# Patient Record
Sex: Male | Born: 1985 | Race: White | Hispanic: No | Marital: Single | Smoking: Current every day smoker
Health system: Southern US, Community
[De-identification: ages and names within clinical notes are randomized; demographics above are authoritative.]

## PROBLEM LIST (undated history)

## (undated) DIAGNOSIS — F909 Attention-deficit hyperactivity disorder, unspecified type: Secondary | ICD-10-CM

## (undated) DIAGNOSIS — F329 Major depressive disorder, single episode, unspecified: Secondary | ICD-10-CM

## (undated) DIAGNOSIS — F1020 Alcohol dependence, uncomplicated: Secondary | ICD-10-CM

## (undated) DIAGNOSIS — R569 Unspecified convulsions: Secondary | ICD-10-CM

## (undated) DIAGNOSIS — F419 Anxiety disorder, unspecified: Secondary | ICD-10-CM

## (undated) DIAGNOSIS — E7800 Pure hypercholesterolemia, unspecified: Secondary | ICD-10-CM

## (undated) DIAGNOSIS — F32A Depression, unspecified: Secondary | ICD-10-CM

---

## 2007-08-23 ENCOUNTER — Emergency Department: Payer: Self-pay | Admitting: Emergency Medicine

## 2007-08-24 ENCOUNTER — Emergency Department: Payer: Self-pay | Admitting: Emergency Medicine

## 2011-11-16 ENCOUNTER — Encounter (HOSPITAL_COMMUNITY): Payer: Self-pay | Admitting: *Deleted

## 2011-11-16 ENCOUNTER — Emergency Department (HOSPITAL_COMMUNITY)
Admission: EM | Admit: 2011-11-16 | Discharge: 2011-11-17 | Disposition: A | Discharge status: Other Acute Inpatient Hospital | Payer: Self-pay | Attending: Emergency Medicine | Admitting: Emergency Medicine

## 2011-11-16 DIAGNOSIS — IMO0002 Reserved for concepts with insufficient information to code with codable children: Secondary | ICD-10-CM

## 2011-11-16 DIAGNOSIS — F489 Nonpsychotic mental disorder, unspecified: Secondary | ICD-10-CM | POA: Insufficient documentation

## 2011-11-16 DIAGNOSIS — X789XXA Intentional self-harm by unspecified sharp object, initial encounter: Secondary | ICD-10-CM | POA: Insufficient documentation

## 2011-11-16 DIAGNOSIS — S1190XA Unspecified open wound of unspecified part of neck, initial encounter: Secondary | ICD-10-CM | POA: Insufficient documentation

## 2011-11-16 HISTORY — DX: Major depressive disorder, single episode, unspecified: F32.9

## 2011-11-16 HISTORY — DX: Attention-deficit hyperactivity disorder, unspecified type: F90.9

## 2011-11-16 HISTORY — DX: Anxiety disorder, unspecified: F41.9

## 2011-11-16 HISTORY — DX: Depression, unspecified: F32.A

## 2011-11-16 LAB — RAPID URINE DRUG SCREEN, HOSP PERFORMED
Amphetamines: NOT DETECTED
Opiates: NOT DETECTED

## 2011-11-16 LAB — CBC WITH DIFFERENTIAL/PLATELET
Basophils Absolute: 0 10*3/uL (ref 0.0–0.1)
Basophils Relative: 0 % (ref 0–1)
Eosinophils Relative: 0 % (ref 0–5)
Lymphocytes Relative: 16 % (ref 12–46)
MCHC: 35.7 g/dL (ref 30.0–36.0)
MCV: 90.8 fL (ref 78.0–100.0)
Neutro Abs: 7.7 10*3/uL (ref 1.7–7.7)
Platelets: 217 10*3/uL (ref 150–400)
RDW: 13 % (ref 11.5–15.5)
WBC: 10.5 10*3/uL (ref 4.0–10.5)

## 2011-11-16 LAB — BASIC METABOLIC PANEL
CO2: 22 mEq/L (ref 19–32)
Calcium: 9 mg/dL (ref 8.4–10.5)
GFR calc Af Amer: >90 mL/min (ref >90)
GFR calc non Af Amer: 84 mL/min — ABNORMAL LOW (ref >90)
Sodium: 142 mEq/L (ref 135–145)

## 2011-11-16 LAB — ETHANOL: Alcohol, Ethyl (B): 217 mg/dL — ABNORMAL HIGH (ref 0–11)

## 2011-11-16 MED ORDER — NICOTINE 14 MG/24HR TD PT24
MEDICATED_PATCH | TRANSDERMAL | Status: AC
  Administered 2011-11-16: 14 mg
  Filled 2011-11-16: qty 1

## 2011-11-16 MED ORDER — FLUOXETINE HCL 20 MG PO CAPS
ORAL_CAPSULE | ORAL | Status: AC
  Filled 2011-11-16: qty 2

## 2011-11-16 MED ORDER — FLUOXETINE HCL 20 MG PO CAPS
40.0000 mg | ORAL_CAPSULE | Freq: Every day | ORAL | Status: DC
  Administered 2011-11-17: 40 mg via ORAL
  Filled 2011-11-16 (×3): qty 2

## 2011-11-16 MED ORDER — LORAZEPAM 1 MG PO TABS
1.0000 mg | ORAL_TABLET | Freq: Three times a day (TID) | ORAL | Status: DC | PRN
  Administered 2011-11-16 – 2011-11-17 (×2): 1 mg via ORAL
  Filled 2011-11-16: qty 1

## 2011-11-16 MED ORDER — LORAZEPAM 1 MG PO TABS
ORAL_TABLET | ORAL | Status: AC
  Administered 2011-11-17: 1 mg via ORAL
  Filled 2011-11-16: qty 1
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
## 2011-11-16 NOTE — ED Provider Notes (Signed)
History     CSN: [id number]  Arrival date & time 11/16/11  1947   First MD Initiated Contact with Patient 11/16/11 2030      Chief Complaint  Patient presents with  . Medical Clearance    (Consider location/radiation/quality/duration/timing/severity/associated sxs/prior treatment) Patient is a 26 y.o. male presenting with altered mental status. The history is provided by the patient (pt states he is suicidal and cut his right side of his throat). No language interpreter was used.  Altered Mental Status This is a new problem. The current episode started 3 to 5 hours ago. The problem occurs rarely. The problem has not changed since onset.Pertinent negatives include no chest pain, no abdominal pain and no headaches. Nothing aggravates the symptoms. Nothing relieves the symptoms. He has tried nothing for the symptoms.    Past Medical History  Diagnosis Date  . Depression   . ADHD (attention deficit hyperactivity disorder)   . Anxiety     History reviewed. No pertinent past surgical history.  History reviewed. No pertinent family history.  History  Substance Use Topics  . Smoking status: Current Everyday Smoker  . Smokeless tobacco: Not on file  . Alcohol Use: Yes      Review of Systems  Constitutional: Negative for fatigue.  HENT: Negative for congestion, sinus pressure and ear discharge.   Eyes: Negative for discharge.  Respiratory: Negative for cough.   Cardiovascular: Negative for chest pain.  Gastrointestinal: Negative for abdominal pain and diarrhea.  Genitourinary: Negative for frequency and hematuria.  Musculoskeletal: Negative for back pain.  Skin: Negative for rash.  Neurological: Negative for seizures and headaches.  Hematological: Negative.   Psychiatric/Behavioral: Positive for suicidal ideas and altered mental status. Negative for hallucinations.    Allergies  Review of patient's allergies indicates no known allergies.  Home Medications   Current  Outpatient Rx  Name Route Sig Dispense Refill  . ASPIRIN-SALICYLAMIDE-CAFFEINE 325-95-16 MG PO TABS Oral Take 1 packet by mouth as needed.    Marland Kitchen FLUOXETINE HCL 40 MG PO CAPS Oral Take 40 mg by mouth every morning.      BP 148/63  Pulse 102  Temp 98.2 F (36.8 C) (Oral)  Resp 20  Ht 6' (1.829 m)  Wt 184 lb (83.462 kg)  BMI 24.95 kg/m2  SpO2 97%  Physical Exam  Constitutional: He is oriented to person, place, and time. He appears well-developed.  HENT:  Head: Normocephalic.       2 cm lac to right side of neck,  superficieal  Eyes: Conjunctivae and EOM are normal. No scleral icterus.  Neck: Neck supple. No thyromegaly present.  Cardiovascular: Normal rate and regular rhythm.  Exam reveals no gallop and no friction rub.   No murmur heard. Pulmonary/Chest: No stridor. He has no wheezes. He has no rales. He exhibits no tenderness.  Abdominal: He exhibits no distension. There is no tenderness. There is no rebound.  Musculoskeletal: Normal range of motion. He exhibits no edema.  Lymphadenopathy:    He has no cervical adenopathy.  Neurological: He is oriented to person, place, and time. Coordination normal.  Skin: No rash noted. No erythema.  Psychiatric:       suicidal    ED Course  LACERATION REPAIR Performed by: Debraann Livingstone L Authorized by: Bethann Berkshire L Comments: Right side of neck with 2 cm lac superficial.  Pt cleaned with betadine.  2 staples put in to close lac.  Pt tolerated procedure well   (including critical care time)  Labs Reviewed  CBC WITH DIFFERENTIAL - Abnormal; Notable for the following:    Monocytes Absolute 1.2 (*)     All other components within normal limits  BASIC METABOLIC PANEL - Abnormal; Notable for the following:    Glucose, Bld 103 (*)     GFR calc non Af Amer 84 (*)     All other components within normal limits  ETHANOL - Abnormal; Notable for the following:    Alcohol, Ethyl (B) 217 (*)     All other components within normal limits    URINE RAPID DRUG SCREEN (HOSP PERFORMED) - Abnormal; Notable for the following:    Tetrahydrocannabinol POSITIVE (*)     All other components within normal limits   No results found.   No diagnosis found.    MDM          Benny Lennert, MD 11/16/11 2151

## 2011-11-17 NOTE — Progress Notes (Signed)
Patient accepted by Verne Spurr, PA at [phone] on 11/17/11 pending 300 hall bed at [facility]. Rosey Bath, RN

## 2011-11-17 NOTE — ED Notes (Signed)
Patient's bag of belongings given to [facility] personnel.

## 2011-11-17 NOTE — ED Provider Notes (Signed)
Pt resting comfortably Has superficial lac to neck, no headache, visual changes or weakness reported Labs/vitals reviewed BP 141/89  Pulse 90  Temp 97.9 F (36.6 C) (Oral)  Resp 20  Ht 6' (1.829 m)  Wt 184 lb (83.462 kg)  BMI 24.95 kg/m2  SpO2 99%   Joya Gaskins, MD 11/17/11 [phone]
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
## 2011-11-17 NOTE — ED Notes (Signed)
[facility] on way to get pt. Report given to [employer] to [facility]

## 2012-03-17 DIAGNOSIS — R569 Unspecified convulsions: Secondary | ICD-10-CM

## 2012-03-17 HISTORY — DX: Unspecified convulsions: R56.9

## 2012-03-18 ENCOUNTER — Encounter (HOSPITAL_COMMUNITY): Payer: Self-pay | Admitting: Emergency Medicine

## 2012-03-18 ENCOUNTER — Inpatient Hospital Stay (HOSPITAL_COMMUNITY)
Admission: EM | Admit: 2012-03-18 | Discharge: 2012-03-19 | DRG: 918 | Discharge status: Against Medical Advice | Payer: Self-pay | Attending: Internal Medicine | Admitting: Internal Medicine

## 2012-03-18 ENCOUNTER — Emergency Department (HOSPITAL_COMMUNITY): Payer: Self-pay

## 2012-03-18 DIAGNOSIS — F32A Depression, unspecified: Secondary | ICD-10-CM

## 2012-03-18 DIAGNOSIS — F1110 Opioid abuse, uncomplicated: Secondary | ICD-10-CM | POA: Diagnosis present

## 2012-03-18 DIAGNOSIS — Z9119 Patient's noncompliance with other medical treatment and regimen: Secondary | ICD-10-CM

## 2012-03-18 DIAGNOSIS — R55 Syncope and collapse: Secondary | ICD-10-CM | POA: Diagnosis present

## 2012-03-18 DIAGNOSIS — Z818 Family history of other mental and behavioral disorders: Secondary | ICD-10-CM

## 2012-03-18 DIAGNOSIS — F909 Attention-deficit hyperactivity disorder, unspecified type: Secondary | ICD-10-CM

## 2012-03-18 DIAGNOSIS — IMO0002 Reserved for concepts with insufficient information to code with codable children: Secondary | ICD-10-CM | POA: Diagnosis present

## 2012-03-18 DIAGNOSIS — R413 Other amnesia: Secondary | ICD-10-CM | POA: Diagnosis present

## 2012-03-18 DIAGNOSIS — Z91199 Patient's noncompliance with other medical treatment and regimen due to unspecified reason: Secondary | ICD-10-CM

## 2012-03-18 DIAGNOSIS — T400X1A Poisoning by opium, accidental (unintentional), initial encounter: Principal | ICD-10-CM | POA: Diagnosis present

## 2012-03-18 DIAGNOSIS — R569 Unspecified convulsions: Secondary | ICD-10-CM

## 2012-03-18 DIAGNOSIS — Z7982 Long term (current) use of aspirin: Secondary | ICD-10-CM

## 2012-03-18 DIAGNOSIS — Y9289 Other specified places as the place of occurrence of the external cause: Secondary | ICD-10-CM

## 2012-03-18 DIAGNOSIS — F329 Major depressive disorder, single episode, unspecified: Secondary | ICD-10-CM | POA: Diagnosis present

## 2012-03-18 DIAGNOSIS — F411 Generalized anxiety disorder: Secondary | ICD-10-CM

## 2012-03-18 DIAGNOSIS — W19XXXA Unspecified fall, initial encounter: Secondary | ICD-10-CM | POA: Diagnosis present

## 2012-03-18 DIAGNOSIS — F1010 Alcohol abuse, uncomplicated: Secondary | ICD-10-CM

## 2012-03-18 DIAGNOSIS — E876 Hypokalemia: Secondary | ICD-10-CM

## 2012-03-18 DIAGNOSIS — F3289 Other specified depressive episodes: Secondary | ICD-10-CM | POA: Diagnosis present

## 2012-03-18 DIAGNOSIS — F1210 Cannabis abuse, uncomplicated: Secondary | ICD-10-CM | POA: Diagnosis present

## 2012-03-18 DIAGNOSIS — F17200 Nicotine dependence, unspecified, uncomplicated: Secondary | ICD-10-CM | POA: Diagnosis present

## 2012-03-18 DIAGNOSIS — F419 Anxiety disorder, unspecified: Secondary | ICD-10-CM

## 2012-03-18 LAB — CBC WITH DIFFERENTIAL/PLATELET
Eosinophils Absolute: 0 10*3/uL (ref 0.0–0.7)
Eosinophils Relative: 0 % (ref 0–5)
HCT: 45.6 % (ref 39.0–52.0)
Hemoglobin: 15.4 g/dL (ref 13.0–17.0)
Lymphocytes Relative: 17 % (ref 12–46)
Lymphs Abs: 3.5 10*3/uL (ref 0.7–4.0)
MCH: 33.2 pg (ref 26.0–34.0)
MCV: 98.3 fL (ref 78.0–100.0)
Monocytes Absolute: 1.9 10*3/uL — ABNORMAL HIGH (ref 0.1–1.0)
Monocytes Relative: 9 % (ref 3–12)
RBC: 4.64 MIL/uL (ref 4.22–5.81)
WBC: 21.4 10*3/uL — ABNORMAL HIGH (ref 4.0–10.5)

## 2012-03-18 LAB — BASIC METABOLIC PANEL
BUN: 8 mg/dL (ref 6–23)
CO2: 15 mEq/L — ABNORMAL LOW (ref 19–32)
Calcium: 9.2 mg/dL (ref 8.4–10.5)
GFR calc non Af Amer: 78 mL/min — ABNORMAL LOW (ref >90)
Glucose, Bld: 111 mg/dL — ABNORMAL HIGH (ref 70–99)

## 2012-03-18 LAB — RAPID URINE DRUG SCREEN, HOSP PERFORMED
Amphetamines: NOT DETECTED
Barbiturates: NOT DETECTED
Opiates: NOT DETECTED
Tetrahydrocannabinol: POSITIVE — AB

## 2012-03-18 MED ORDER — PHENYTOIN SODIUM 50 MG/ML IJ SOLN
INTRAMUSCULAR | Status: AC
  Filled 2012-03-18: qty 20

## 2012-03-18 MED ORDER — LORAZEPAM 2 MG/ML IJ SOLN: 1.0000 mg | Freq: Once | INTRAMUSCULAR | Status: DC

## 2012-03-18 MED ORDER — SODIUM CHLORIDE 0.9 % IV BOLUS (SEPSIS)
1000.0000 mL | Freq: Once | INTRAVENOUS | Status: AC
  Administered 2012-03-18: 1000 mL via INTRAVENOUS

## 2012-03-18 MED ORDER — POTASSIUM CHLORIDE 10 MEQ/100ML IV SOLN
10.0000 meq | INTRAVENOUS | Status: AC
  Administered 2012-03-18 – 2012-03-19 (×5): 10 meq via INTRAVENOUS
  Filled 2012-03-18: qty 400
  Filled 2012-03-18: qty 100

## 2012-03-18 MED ORDER — LORAZEPAM 2 MG/ML IJ SOLN
INTRAMUSCULAR | Status: AC
  Filled 2012-03-18: qty 1

## 2012-03-18 MED ORDER — LORAZEPAM 2 MG/ML IJ SOLN
2.0000 mg | Freq: Once | INTRAMUSCULAR | Status: AC
  Administered 2012-03-18: 2 mg via INTRAVENOUS

## 2012-03-18 MED ORDER — SODIUM CHLORIDE 0.9 % IV SOLN
1000.0000 mg | Freq: Once | INTRAVENOUS | Status: AC
  Administered 2012-03-18: 1000 mg via INTRAVENOUS
  Filled 2012-03-18: qty 20

## 2012-03-18 NOTE — ED Notes (Signed)
Patient states he took approximately 5 tramadol today and feels like that may have contributed to his seizure.

## 2012-03-18 NOTE — ED Notes (Signed)
Placed patient on monitor. Also placed ice pack to forehead.

## 2012-03-18 NOTE — ED Notes (Signed)
Patient denies back pain or neck pain. Patient complaining of nausea. Removed backboard per protocol and raised head of bed 30 degrees. Patient dry heaving at this time. No vomiting noted.

## 2012-03-18 NOTE — ED Notes (Signed)
Cleaned patients wounds and covered with gauze. No needs voiced by patient at this time.

## 2012-03-18 NOTE — ED Notes (Signed)
Was at bedside with patient with patient went into a seizure. Patient went stiff and shook all over with saliva forming at the mouth. Placed patient on his side. Seizure lasted approximately 30 seconds. MD summoned to bedside. Suctioned patient's mouth. Patient awake at this time but disoriented and states he does not remember anything.

## 2012-03-18 NOTE — ED Provider Notes (Signed)
History   This chart was scribed for Donnetta Hutching, MD by Sofie Rower, ED Scribe. The patient was seen in room APA08/[facility]08 and the patient's care was started at 9:02PM.     CSN: [id number]  Arrival date & time 03/18/12  2017   First MD Initiated Contact with Patient 03/18/12 2102      Chief Complaint  Patient presents with  . Seizures  . Head Injury    (Consider location/radiation/quality/duration/timing/severity/associated sxs/prior treatment) The history is provided by the patient and a parent. No language interpreter was used.    Joshua Dillon is a 26 y.o. male who presents to the Emergency Department complaining of sudden, progressively worsening, seizure, onset today (03/18/12). The pt reports he was en route to his AA meeting this afternoon, after drinking one 40 oz beer, and taking a tramadol. The pt cannot recall the details of the following events, but he suddenly woke up in the parking lot where his AA meeting was planned to take place, lying on his back. The pt informs he usually drinks 1-2 40 oz beers per day. The pt has a hx of depression, ADHD, and anxiety.  The pt is a current everyday smoker, in addition to drinking alcohol on a daily basis.   Past Medical History  Diagnosis Date  . Depression   . ADHD (attention deficit hyperactivity disorder)   . Anxiety     History reviewed. No pertinent past surgical history.  History reviewed. No pertinent family history.  History  Substance Use Topics  . Smoking status: Current Every Day Smoker  . Smokeless tobacco: Not on file  . Alcohol Use: Yes     Comment: daily      Review of Systems  Neurological: Positive for seizures.  All other systems reviewed and are negative.    Allergies  Review of patient's allergies indicates no known allergies.  Home Medications   Current Outpatient Rx  Name  Route  Sig  Dispense  Refill  . ASPIRIN-SALICYLAMIDE-CAFFEINE 325-95-16 MG PO TABS   Oral   Take 1 packet by mouth  as needed.         Marland Kitchen FLUOXETINE HCL 40 MG PO CAPS   Oral   Take 40 mg by mouth every morning.           BP 154/95  Pulse 120  Temp 97.7 F (36.5 C) (Oral)  Resp 16  Ht 6' (1.829 m)  Wt 200 lb (90.719 kg)  BMI 27.12 kg/m2  SpO2 99%  Physical Exam  Nursing note and vitals reviewed. Constitutional: He is oriented to person, place, and time. He appears well-developed and well-nourished.  HENT:  Head: Normocephalic and atraumatic.       Abrasions on forehead and left temporal area.   Eyes: Conjunctivae normal and EOM are normal. Pupils are equal, round, and reactive to light.  Neck: Normal range of motion. Neck supple.  Cardiovascular: Normal rate, regular rhythm and normal heart sounds.   Pulmonary/Chest: Effort normal and breath sounds normal.  Abdominal: Soft. Bowel sounds are normal.  Musculoskeletal: Normal range of motion.  Neurological: He is alert and oriented to person, place, and time.  Skin: Skin is warm and dry.  Psychiatric: He has a normal mood and affect.    ED Course  Procedures (including critical care time)  DIAGNOSTIC STUDIES: Oxygen Saturation is 99% on room air, normal by my interpretation.    COORDINATION OF CARE:  9:25 PM- Treatment plan concerning management of alcoholism, attendance at [facility]  meetings, CT head, CT cervical spine, laboratory analysis and drug screen discussed with patient. Pt agrees with treatment.  Ct Head Wo Contrast  03/18/2012  *RADIOLOGY REPORT*  Clinical Data:  Progressively worsening seizure; left frontal scalp hematoma.  Concern for cervical spine injury.  CT HEAD WITHOUT CONTRAST AND CT CERVICAL SPINE WITHOUT CONTRAST  Technique:  Multidetector CT imaging of the head and cervical spine was performed following the standard protocol without intravenous contrast.  Multiplanar CT image reconstructions of the cervical spine were also generated.  Comparison: None  CT HEAD  Findings: There is no evidence of acute infarction, mass  lesion, or intra- or extra-axial hemorrhage on CT.  The posterior fossa, including the cerebellum, brainstem and fourth ventricle, is within normal limits.  The third and lateral ventricles, and basal ganglia are unremarkable in appearance.  The cerebral hemispheres are symmetric in appearance, with normal gray- white differentiation.  No mass effect or midline shift is seen.  There is no evidence of fracture; visualized osseous structures are unremarkable in appearance.  The visualized portions of the orbits are within normal limits.  The paranasal sinuses and mastoid air cells are well-aerated.  Mild soft tissue swelling is noted overlying the left frontal calvarium, and surrounding the left orbit.  IMPRESSION:  1.  No evidence of traumatic intracranial injury or fracture. 2.  Mild soft tissue swelling overlying the left frontal calvarium, and surrounding the left orbit.  CT CERVICAL SPINE  Findings: There is no evidence of fracture or subluxation. Vertebral bodies demonstrate normal height and alignment.  There is minimal narrowing of the intervertebral disc space at C5-C6, with associated anterior and posterior disc osteophyte complexes. Prevertebral soft tissues are within normal limits.  The visualized neural foramina are grossly unremarkable.  The thyroid gland is unremarkable in appearance.  The visualized lung apices are clear.  No significant soft tissue abnormalities are seen.  IMPRESSION:  1.  No evidence of fracture or subluxation along the cervical spine.  2.  Minimal degenerative change at the mid cervical spine.   Original Report Authenticated By: Tonia Ghent, M.D.    Ct Cervical Spine Wo Contrast  03/18/2012  *RADIOLOGY REPORT*  Clinical Data:  Progressively worsening seizure; left frontal scalp hematoma.  Concern for cervical spine injury.  CT HEAD WITHOUT CONTRAST AND CT CERVICAL SPINE WITHOUT CONTRAST  Technique:  Multidetector CT imaging of the head and cervical spine was performed  following the standard protocol without intravenous contrast.  Multiplanar CT image reconstructions of the cervical spine were also generated.  Comparison: None  CT HEAD  Findings: There is no evidence of acute infarction, mass lesion, or intra- or extra-axial hemorrhage on CT.  The posterior fossa, including the cerebellum, brainstem and fourth ventricle, is within normal limits.  The third and lateral ventricles, and basal ganglia are unremarkable in appearance.  The cerebral hemispheres are symmetric in appearance, with normal gray- white differentiation.  No mass effect or midline shift is seen.  There is no evidence of fracture; visualized osseous structures are unremarkable in appearance.  The visualized portions of the orbits are within normal limits.  The paranasal sinuses and mastoid air cells are well-aerated.  Mild soft tissue swelling is noted overlying the left frontal calvarium, and surrounding the left orbit.  IMPRESSION:  1.  No evidence of traumatic intracranial injury or fracture. 2.  Mild soft tissue swelling overlying the left frontal calvarium, and surrounding the left orbit.  CT CERVICAL SPINE  Findings: There is no evidence of  fracture or subluxation. Vertebral bodies demonstrate normal height and alignment.  There is minimal narrowing of the intervertebral disc space at C5-C6, with associated anterior and posterior disc osteophyte complexes. Prevertebral soft tissues are within normal limits.  The visualized neural foramina are grossly unremarkable.  The thyroid gland is unremarkable in appearance.  The visualized lung apices are clear.  No significant soft tissue abnormalities are seen.  IMPRESSION:  1.  No evidence of fracture or subluxation along the cervical spine.  2.  Minimal degenerative change at the mid cervical spine.   Original Report Authenticated By: Tonia Ghent, M.D.     Results for orders placed during the hospital encounter of 03/18/12  CBC WITH DIFFERENTIAL       Component Value Range   WBC 21.4 (*) 4.0 - 10.5 K/uL   RBC 4.64  4.22 - 5.81 MIL/uL   Hemoglobin 15.4  13.0 - 17.0 g/dL   HCT 40.9  81.1 - 91.4 %   MCV 98.3  78.0 - 100.0 fL   MCH 33.2  26.0 - 34.0 pg   MCHC 33.8  30.0 - 36.0 g/dL   RDW 78.2  95.6 - 21.3 %   Platelets 283  150 - 400 K/uL   Neutrophils Relative 74  43 - 77 %   Neutro Abs 15.9 (*) 1.7 - 7.7 K/uL   Lymphocytes Relative 17  12 - 46 %   Lymphs Abs 3.5  0.7 - 4.0 K/uL   Monocytes Relative 9  3 - 12 %   Monocytes Absolute 1.9 (*) 0.1 - 1.0 K/uL   Eosinophils Relative 0  0 - 5 %   Eosinophils Absolute 0.0  0.0 - 0.7 K/uL   Basophils Relative 0  0 - 1 %   Basophils Absolute 0.0  0.0 - 0.1 K/uL  BASIC METABOLIC PANEL      Component Value Range   Sodium 144  135 - 145 mEq/L   Potassium 2.8 (*) 3.5 - 5.1 mEq/L   Chloride 101  96 - 112 mEq/L   CO2 15 (*) 19 - 32 mEq/L   Glucose, Bld 111 (*) 70 - 99 mg/dL   BUN 8  6 - 23 mg/dL   Creatinine, Ser 0.86  0.50 - 1.35 mg/dL   Calcium 9.2  8.4 - 57.8 mg/dL   GFR calc non Af Amer 78 (*) >90 mL/min   GFR calc Af Amer >90  >90 mL/min  ETHANOL      Component Value Range   Alcohol, Ethyl (B) <11  0 - 11 mg/dL    Labs Reviewed - No data to display No results found.   No diagnosis found.  CRITICAL CARE Performed by: Donnetta Hutching   Total critical care time: 30  Critical care time was exclusive of separately billable procedures and treating other patients.  Critical care was necessary to treat or prevent imminent or life-threatening deterioration.  Critical care was time spent personally by me on the following activities: development of treatment plan with patient and/or surrogate as well as nursing, discussions with consultants, evaluation of patient's response to treatment, examination of patient, obtaining history from patient or surrogate, ordering and performing treatments and interventions, ordering and review of laboratory studies, ordering and review of radiographic  studies, pulse oximetry and re-evaluation of patient's condition.  MDM  Suspect alcohol withdrawal seizure.  Patient had grand mal seizure in ED.  Rx IV Ativan, IV Dilantin.  Admit to general medicine      I personally performed  the services described in this documentation, which was scribed in my presence. The recorded information has been reviewed and is accurate.    Donnetta Hutching, MD 03/18/12 [phone]

## 2012-03-18 NOTE — ED Notes (Addendum)
Patient states he had a seizure and fell and hit the front of his head on asphalt. EMS states bystanders states the seizure lasted approximately 3 minutes. Patient has abrasions and hematoma on left forehead. Patient denies history of seizures. States he drinks daily and last drink was this morning. Patient is alert and oriented at triage. Patient has on c-collar and is on backboard upon arrival to ED.

## 2012-03-19 ENCOUNTER — Encounter (HOSPITAL_COMMUNITY): Payer: Self-pay | Admitting: Internal Medicine

## 2012-03-19 ENCOUNTER — Other Ambulatory Visit (HOSPITAL_COMMUNITY): Payer: Self-pay

## 2012-03-19 DIAGNOSIS — R569 Unspecified convulsions: Secondary | ICD-10-CM | POA: Diagnosis present

## 2012-03-19 DIAGNOSIS — F909 Attention-deficit hyperactivity disorder, unspecified type: Secondary | ICD-10-CM | POA: Diagnosis present

## 2012-03-19 DIAGNOSIS — F329 Major depressive disorder, single episode, unspecified: Secondary | ICD-10-CM

## 2012-03-19 DIAGNOSIS — F3289 Other specified depressive episodes: Secondary | ICD-10-CM

## 2012-03-19 DIAGNOSIS — F419 Anxiety disorder, unspecified: Secondary | ICD-10-CM | POA: Diagnosis present

## 2012-03-19 DIAGNOSIS — F1010 Alcohol abuse, uncomplicated: Secondary | ICD-10-CM | POA: Diagnosis present

## 2012-03-19 DIAGNOSIS — E876 Hypokalemia: Secondary | ICD-10-CM | POA: Diagnosis present

## 2012-03-19 LAB — CBC
Platelets: 226 10*3/uL (ref 150–400)
RBC: 4.09 MIL/uL — ABNORMAL LOW (ref 4.22–5.81)
RDW: 12.2 % (ref 11.5–15.5)
WBC: 11.9 10*3/uL — ABNORMAL HIGH (ref 4.0–10.5)

## 2012-03-19 LAB — HEPATIC FUNCTION PANEL
ALT: 24 U/L (ref 0–53)
AST: 28 U/L (ref 0–37)
Albumin: 4.3 g/dL (ref 3.5–5.2)
Alkaline Phosphatase: 89 U/L (ref 39–117)
Bilirubin, Direct: 0.1 mg/dL (ref 0.0–0.3)
Total Bilirubin: 0.3 mg/dL (ref 0.3–1.2)

## 2012-03-19 LAB — URINALYSIS, ROUTINE W REFLEX MICROSCOPIC
Glucose, UA: NEGATIVE mg/dL
Ketones, ur: NEGATIVE mg/dL
Leukocytes, UA: NEGATIVE
Nitrite: NEGATIVE
pH: 6.5 (ref 5.0–8.0)

## 2012-03-19 LAB — APTT: aPTT: 29 seconds (ref 24–37)

## 2012-03-19 LAB — BASIC METABOLIC PANEL
CO2: 24 mEq/L (ref 19–32)
Calcium: 8.4 mg/dL (ref 8.4–10.5)
Chloride: 105 mEq/L (ref 96–112)
Creatinine, Ser: 1.04 mg/dL (ref 0.50–1.35)
GFR calc Af Amer: >90 mL/min (ref >90)
Sodium: 140 mEq/L (ref 135–145)

## 2012-03-19 LAB — TSH: TSH: 2.339 u[IU]/mL (ref 0.350–4.500)

## 2012-03-19 MED ORDER — NICOTINE 21 MG/24HR TD PT24
21.0000 mg | MEDICATED_PATCH | Freq: Every day | TRANSDERMAL | Status: DC
  Administered 2012-03-19 (×2): 21 mg via TRANSDERMAL
  Filled 2012-03-19 (×2): qty 1

## 2012-03-19 MED ORDER — PHENYTOIN SODIUM EXTENDED 100 MG PO CAPS
300.0000 mg | ORAL_CAPSULE | Freq: Every day | ORAL | Status: DC
  Administered 2012-03-19: 300 mg via ORAL
  Filled 2012-03-19: qty 3

## 2012-03-19 MED ORDER — LORAZEPAM 1 MG PO TABS
1.0000 mg | ORAL_TABLET | Freq: Four times a day (QID) | ORAL | Status: DC | PRN
  Administered 2012-03-19: 1 mg via ORAL
  Filled 2012-03-19 (×2): qty 1

## 2012-03-19 MED ORDER — ADULT MULTIVITAMIN W/MINERALS CH
1.0000 | ORAL_TABLET | Freq: Every day | ORAL | Status: DC
  Administered 2012-03-19: 1 via ORAL
  Filled 2012-03-19: qty 1

## 2012-03-19 MED ORDER — THIAMINE HCL 100 MG/ML IJ SOLN
INTRAMUSCULAR | Status: AC
  Filled 2012-03-19: qty 2

## 2012-03-19 MED ORDER — ONDANSETRON HCL 4 MG/2ML IJ SOLN: 4.0000 mg | INTRAMUSCULAR | Status: DC | PRN

## 2012-03-19 MED ORDER — FLEET ENEMA 7-19 GM/118ML RE ENEM: 1.0000 | ENEMA | Freq: Once | RECTAL | Status: DC | PRN

## 2012-03-19 MED ORDER — SODIUM CHLORIDE 0.9 % IV SOLN
INTRAVENOUS | Status: DC
  Filled 2012-03-19 (×6): qty 1000

## 2012-03-19 MED ORDER — LORAZEPAM 2 MG/ML IJ SOLN
1.0000 mg | Freq: Four times a day (QID) | INTRAMUSCULAR | Status: DC | PRN
  Filled 2012-03-19: qty 1

## 2012-03-19 MED ORDER — FLUOXETINE HCL 20 MG PO CAPS
40.0000 mg | ORAL_CAPSULE | Freq: Every day | ORAL | Status: DC
  Administered 2012-03-19: 40 mg via ORAL
  Filled 2012-03-19: qty 2

## 2012-03-19 MED ORDER — M.V.I. ADULT IV INJ
INJECTION | INTRAVENOUS | Status: AC
  Filled 2012-03-19: qty 10

## 2012-03-19 MED ORDER — ACETAMINOPHEN 500 MG PO TABS
500.0000 mg | ORAL_TABLET | Freq: Four times a day (QID) | ORAL | Status: DC | PRN
  Administered 2012-03-19 (×2): 500 mg via ORAL
  Filled 2012-03-19 (×2): qty 1

## 2012-03-19 MED ORDER — THIAMINE HCL 100 MG/ML IJ SOLN: 100.0000 mg | Freq: Every day | INTRAMUSCULAR | Status: DC

## 2012-03-19 MED ORDER — BISACODYL 5 MG PO TBEC: 5.0000 mg | DELAYED_RELEASE_TABLET | Freq: Every day | ORAL | Status: DC | PRN

## 2012-03-19 MED ORDER — SODIUM CHLORIDE 0.9 % IJ SOLN: 3.0000 mL | Freq: Two times a day (BID) | INTRAMUSCULAR | Status: DC

## 2012-03-19 MED ORDER — LORAZEPAM 2 MG/ML IJ SOLN: 0.0000 mg | Freq: Two times a day (BID) | INTRAMUSCULAR | Status: DC

## 2012-03-19 MED ORDER — VITAMIN B-1 100 MG PO TABS
100.0000 mg | ORAL_TABLET | Freq: Every day | ORAL | Status: DC
  Administered 2012-03-19: 100 mg via ORAL
  Filled 2012-03-19: qty 1

## 2012-03-19 MED ORDER — POLYETHYLENE GLYCOL 3350 17 G PO PACK: 17.0000 g | PACK | Freq: Every day | ORAL | Status: DC | PRN

## 2012-03-19 MED ORDER — FOLIC ACID 5 MG/ML IJ SOLN
INTRAMUSCULAR | Status: AC
  Filled 2012-03-19: qty 0.2

## 2012-03-19 MED ORDER — LORAZEPAM 2 MG/ML IJ SOLN
0.0000 mg | Freq: Four times a day (QID) | INTRAMUSCULAR | Status: DC
  Administered 2012-03-19 (×2): 1 mg via INTRAVENOUS
  Filled 2012-03-19: qty 1

## 2012-03-19 MED ORDER — THIAMINE HCL 100 MG/ML IJ SOLN
Freq: Once | INTRAVENOUS | Status: AC
  Administered 2012-03-19: 02:00:00 via INTRAVENOUS
  Filled 2012-03-19: qty 1000

## 2012-03-19 MED ORDER — INFLUENZA VIRUS VACC SPLIT PF IM SUSP
0.5000 mL | Freq: Once | INTRAMUSCULAR | Status: AC
  Administered 2012-03-19: 0.5 mL via INTRAMUSCULAR
  Filled 2012-03-19: qty 0.5

## 2012-03-19 MED ORDER — FOLIC ACID 1 MG PO TABS
1.0000 mg | ORAL_TABLET | Freq: Every day | ORAL | Status: DC
  Administered 2012-03-19: 1 mg via ORAL
  Filled 2012-03-19: qty 1

## 2012-03-19 MED ORDER — POTASSIUM CHLORIDE IN NACL 20-0.9 MEQ/L-% IV SOLN
INTRAVENOUS | Status: DC
  Administered 2012-03-19: 09:00:00 via INTRAVENOUS

## 2012-03-19 MED ORDER — POTASSIUM CHLORIDE CRYS ER 20 MEQ PO TBCR
40.0000 meq | EXTENDED_RELEASE_TABLET | Freq: Once | ORAL | Status: AC
  Administered 2012-03-19: 40 meq via ORAL
  Filled 2012-03-19: qty 2

## 2012-03-19 MED ORDER — PNEUMOCOCCAL VAC POLYVALENT 25 MCG/0.5ML IJ INJ
0.5000 mL | INJECTION | Freq: Once | INTRAMUSCULAR | Status: AC
  Administered 2012-03-19: 0.5 mL via INTRAMUSCULAR
  Filled 2012-03-19: qty 0.5

## 2012-03-19 NOTE — H&P (Signed)
[facility] History and Physical  Joshua Dillon  RUE:[id number]  DOB: 03-16-1986   DOA: 03/18/2012   PCP:   No primary provider on file.   Chief Complaint:  Seizure  HPI: Joshua Dillon is an 26 y.o. male.   Young Caucasian gentleman with history of alcohol abuse, and a family history of a addictions, has been attending Alcoholics Anonymous for the past 6 weeks, but continues to drink intermittently.. his last drink was yesterday morning says he drank 40 ounces of beer.  He should was in fact brought in because he had a grand mal seizure suffered injuries to his head and arms, and in the emergency room had a further witnessed grand mal seizure. He was given intravenous Ativan and loaded with Dilantin, but is now again become alert and agitated and the hospitalist service is been called to assist with management.  He denies any previous history of seizures, although the records indicate that his father says he used to take Depakote. He does have a significant psychiatric history including anxiety and depression, and ADHD, but is apparently not seeing a psychiatrist. He says that he is very interested in getting alcohol detox and is very serious about quitting, but in conversing with him he seems somewhat confused and his mind wanders constantly.  Admits to occasional marijuana use last use a few days ago; denies cocaine use.  Gives no history of fever or headache prior to the trauma associated with the seizures.   Rewiew of Systems:   All systems negative except as marked bold or noted in the HPI;  Constitutional: Negative for malaise, fever and chills. ;  Eyes: Negative for eye pain, redness and discharge. ;  ENMT: Negative for ear pain, hoarseness, nasal congestion, sinus pressure and sore throat. ;  Cardiovascular: Negative for chest pain, palpitations, diaphoresis, dyspnea and peripheral edema. ;  Respiratory: Negative for cough, hemoptysis, wheezing and stridor. ;   Gastrointestinal: Negative for nausea, vomiting, diarrhea, constipation, abdominal pain, melena, blood in stool, hematemesis, jaundice and rectal bleeding. unusual weight loss..   Genitourinary: Negative for frequency, dysuria, incontinence,flank pain and hematuria; Musculoskeletal: Negative for back pain and neck pain. Negative for swelling and trauma.;  Skin: . Negative for pruritus, rash,; ulcerations;  abrasions, bruising and skin lesion secondary to trauma during seizure. Neuro: Negative for headache, lightheadedness and neck stiffness. Negative for weakness, altered level of consciousness , altered mental status, extremity weakness, burning feet, involuntary movement,  syncope.  Psych: As noted above   Past Medical History  Diagnosis Date  . Depression   . ADHD (attention deficit hyperactivity disorder)   . Anxiety     History reviewed. No pertinent past surgical history.  Medications:  HOME MEDS: Prior to Admission medications   Medication Sig Start Date End Date Taking? Authorizing Provider  Aspirin-Salicylamide-Caffeine (BC HEADACHE) 325-95-16 MG TABS Take 1 packet by mouth as needed.   Yes Historical Provider, MD  FLUoxetine (PROZAC) 40 MG capsule Take 40 mg by mouth 2 (two) times daily.    Yes Historical Provider, MD     Allergies:  No Known Allergies  Social History:   reports that he has been smoking Cigarettes.  He has a 2.4 pack-year smoking history. He does not have any smokeless tobacco history on file. He reports that he drinks about 16.5 ounces of alcohol per week. He reports that he uses illicit drugs (Marijuana).  Family History: Family History  Problem Relation Age of Onset  . Drug abuse Mother  narcotics  . Alcohol abuse Paternal Grandmother      Physical Exam: Filed Vitals:   03/18/12 2311 03/19/12 0027 03/19/12 0039 03/19/12 0054  BP: 153/100 139/88 139/88 156/88  Pulse: 124 114 114 104  Temp: 98.5 F (36.9 C) 98.1 F (36.7 C)  98.3 F  (36.8 C)  TempSrc: Oral Oral  Oral  Resp: 20 16  20   Height:      Weight:    88.6 kg (195 lb 5.2 oz)  SpO2: 97% 97%  100%   Blood pressure 156/88, pulse 104, temperature 98.3 F (36.8 C), temperature source Oral, resp. rate 20, height 6' (1.829 m), weight 88.6 kg (195 lb 5.2 oz), SpO2 100.00%.  GEN:  Hyperalert a young gentleman sitting in the stretcher ; cooperative with exam PSYCH:  alert and oriented x4; very anxious; . HEENT: Mucous membranes pink and anicteric; no PERRLA; EOM intact; no cervical lymphadenopathy nor thyromegaly or carotid bruit; no JVD; extensive abrasions over the left side of her for head and zygoma and temporal Breasts:: Not examined CHEST WALL: No tenderness CHEST: Normal respiration, clear to auscultation bilaterally HEART: Regular rate and rhythm; no murmurs rubs or gallops BACK: No kyphosis or scoliosis; no CVA tenderness ABDOMEN:  soft non-tender; no masses, no organomegaly, normal abdominal bowel sounds; no pannus; no intertriginous candida. Rectal Exam: Not done EXTREMITIES: No bone or joint deformity; abrasions right hand; no edema; Genitalia: not examined PULSES: 2+ and symmetric SKIN: Normal hydration no rash or ulceration CNS: Cranial nerves 2-12 grossly intact no focal lateralizing neurologic deficit   Labs on Admission:  Basic Metabolic Panel:  Lab 03/18/12 1610  NA 144  K 2.8*  CL 101  CO2 15*  GLUCOSE 111*  BUN 8  CREATININE 1.24  CALCIUM 9.2  MG --  PHOS --   Liver Function Tests: No results found for this basename: AST:5,ALT:5,ALKPHOS:5,BILITOT:5,PROT:5,ALBUMIN:5 in the last 168 hours No results found for this basename: LIPASE:5,AMYLASE:5 in the last 168 hours No results found for this basename: AMMONIA:5 in the last 168 hours CBC:  Lab 03/18/12 2200  WBC 21.4*  NEUTROABS 15.9*  HGB 15.4  HCT 45.6  MCV 98.3  PLT 283   Cardiac Enzymes: No results found for this basename: CKTOTAL:5,CKMB:5,CKMBINDEX:5,TROPONINI:5 in the  last 168 hours BNP: No components found with this basename: POCBNP:5 D-dimer: No components found with this basename: D-DIMER:5 CBG: No results found for this basename: GLUCAP:5 in the last 168 hours  Radiological Exams on Admission: Ct Head Wo Contrast  03/18/2012  *RADIOLOGY REPORT*  Clinical Data:  Progressively worsening seizure; left frontal scalp hematoma.  Concern for cervical spine injury.  CT HEAD WITHOUT CONTRAST AND CT CERVICAL SPINE WITHOUT CONTRAST  Technique:  Multidetector CT imaging of the head and cervical spine was performed following the standard protocol without intravenous contrast.  Multiplanar CT image reconstructions of the cervical spine were also generated.  Comparison: None  CT HEAD  Findings: There is no evidence of acute infarction, mass lesion, or intra- or extra-axial hemorrhage on CT.  The posterior fossa, including the cerebellum, brainstem and fourth ventricle, is within normal limits.  The third and lateral ventricles, and basal ganglia are unremarkable in appearance.  The cerebral hemispheres are symmetric in appearance, with normal gray- white differentiation.  No mass effect or midline shift is seen.  There is no evidence of fracture; visualized osseous structures are unremarkable in appearance.  The visualized portions of the orbits are within normal limits.  The paranasal sinuses and mastoid  air cells are well-aerated.  Mild soft tissue swelling is noted overlying the left frontal calvarium, and surrounding the left orbit.  IMPRESSION:  1.  No evidence of traumatic intracranial injury or fracture. 2.  Mild soft tissue swelling overlying the left frontal calvarium, and surrounding the left orbit.  CT CERVICAL SPINE  Findings: There is no evidence of fracture or subluxation. Vertebral bodies demonstrate normal height and alignment.  There is minimal narrowing of the intervertebral disc space at C5-C6, with associated anterior and posterior disc osteophyte complexes.  Prevertebral soft tissues are within normal limits.  The visualized neural foramina are grossly unremarkable.  The thyroid gland is unremarkable in appearance.  The visualized lung apices are clear.  No significant soft tissue abnormalities are seen.  IMPRESSION:  1.  No evidence of fracture or subluxation along the cervical spine.  2.  Minimal degenerative change at the mid cervical spine.   Original Report Authenticated By: Tonia Ghent, M.D.    Ct Cervical Spine Wo Contrast  03/18/2012  *RADIOLOGY REPORT*  Clinical Data:  Progressively worsening seizure; left frontal scalp hematoma.  Concern for cervical spine injury.  CT HEAD WITHOUT CONTRAST AND CT CERVICAL SPINE WITHOUT CONTRAST  Technique:  Multidetector CT imaging of the head and cervical spine was performed following the standard protocol without intravenous contrast.  Multiplanar CT image reconstructions of the cervical spine were also generated.  Comparison: None  CT HEAD  Findings: There is no evidence of acute infarction, mass lesion, or intra- or extra-axial hemorrhage on CT.  The posterior fossa, including the cerebellum, brainstem and fourth ventricle, is within normal limits.  The third and lateral ventricles, and basal ganglia are unremarkable in appearance.  The cerebral hemispheres are symmetric in appearance, with normal gray- white differentiation.  No mass effect or midline shift is seen.  There is no evidence of fracture; visualized osseous structures are unremarkable in appearance.  The visualized portions of the orbits are within normal limits.  The paranasal sinuses and mastoid air cells are well-aerated.  Mild soft tissue swelling is noted overlying the left frontal calvarium, and surrounding the left orbit.  IMPRESSION:  1.  No evidence of traumatic intracranial injury or fracture. 2.  Mild soft tissue swelling overlying the left frontal calvarium, and surrounding the left orbit.  CT CERVICAL SPINE  Findings: There is no evidence of  fracture or subluxation. Vertebral bodies demonstrate normal height and alignment.  There is minimal narrowing of the intervertebral disc space at C5-C6, with associated anterior and posterior disc osteophyte complexes. Prevertebral soft tissues are within normal limits.  The visualized neural foramina are grossly unremarkable.  The thyroid gland is unremarkable in appearance.  The visualized lung apices are clear.  No significant soft tissue abnormalities are seen.  IMPRESSION:  1.  No evidence of fracture or subluxation along the cervical spine.  2.  Minimal degenerative change at the mid cervical spine.   Original Report Authenticated By: Tonia Ghent, M.D.       Assessment/Plan Present on Admission:  . Seizure, questionable no focal questionable related to alcohol withdrawal  . Alcohol abuse . Hypokalemia . ADHD (attention deficit hyperactivity disorder) . Anxiety . Depression Possible bipolar disorder  PLAN: We'll admit this gentleman because of recurrent seizures and will start alcohol withdrawal protocol with benzodiazepines vitamins,  And fluid fluid.  Explained to this gentleman that alcohol detox does not have to be an inpatient procedure, and there is no magic medication that will eliminate his desire for  alcohol.  Will get a neurology consult at some time.  Will reduce the dose of his fluoxetine because of the seizure.  Drug cessation counseling and nicotine replacement as needed. Further clarification of his Depakote she was  Other plans as per orders.  Code Status: FULL CODE  Family Communication:  Disposition Plan: Depending on response to therapy    Xzander Gilham Nocturnist [facility] Pager [phone]  03/19/2012, 1:15 AM

## 2012-03-19 NOTE — Clinical Social Work Psychosocial (Signed)
Clinical Social Work Department BRIEF PSYCHOSOCIAL ASSESSMENT 03/19/2012  Patient:  Joshua Joshua Dillon, Joshua Dillon     Account Number:  [id number]     Admit date:  03/18/2012  Clinical Social Worker:  Santa Genera, CLINICAL SOCIAL WORKER  Date/Time:  03/19/2012 01:00 PM  Referred by:  Physician  Date Referred:  03/19/2012 Referred for  Substance Abuse   Other Referral:   Interview type:  Patient Other interview type:   Girlfriend in room w patient's consent    PSYCHOSOCIAL DATA Living Status:  ALONE Admitted from facility:   Level of care:   Primary support name:  [name], [name] (girlfriend) Primary support relationship to patient:  PARENT Degree of support available:   Significant.    CURRENT CONCERNS Current Concerns  Substance Abuse   Other Concerns:    SOCIAL WORK ASSESSMENT / PLAN CSW met w patient in room, girlfriend present w patient consent.  Patient oriented x4, sleepy.  Wants to be discharged.  Patient admits to problem w alcohol use, admits to drinking 1 - 5 40 oz beers/day.  Level of consumption depends on whether patient has money, if he has money, he buys beer.  Patient has attended AA for past 6 weeks, but has not achieved sobriety.  Has friends in [place], including current girlfriend.  Had seizure in parking lot on way to AA meeting yesterday.    Patient has history of prescription pain pill abuse, beginning at age 26 when he was prescribed Vicodin for wisdom tooth extraction in [employer].  Received pain pills for various ailments, remained opioid dependent until he was "locked in my grandmother's basement for 2 months" and was able to detox from pills.  Says "I smoked pot whenever I felt bad" during detox process, and continues to use marijuana.  Tested positive at current [facility] admission. Denies current use of pain pills.    Patient admits to problem w drinking.  Administered SBIRT, w patient scoring 34 indicating hazardous drinking. Patient began drinking  beer two years ago and has progressed to current level of consumption.    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 States his ADD made doing the job difficult.  Since returning to [facility], patient has worked for his family.  Family pays his bills and rent so he will not have money in his possession.    Patient has supportive girlfriend who is also in recovery. Family continues to support his attendance at [facility] and have provided financial support as well.    Patient wants referral to outpatient substance abuse assessment. Says he thinks he needs "detox" for a "month or so."  However, does not want this immediately as "I need to say my goodbyes first."  Patient has no insurance and wants referral to [facility] for outpatient substance abuse assessment to determine level of care needed.  Patient has not been successful in becoming sober by attending AA meetings, concerned that he needs inpatient or residential treatment setting.  Patient mentioned wanting to go to [facility], but he lacks insurance for this facility.   Assessment/plan status:  Psychosocial Support/Ongoing Assessment of Needs Other assessment/ plan:   CSW will ask MD for ACT team consult order  ACT team will recommend appropriate treatment resources   Information/referral to community resources:   List of AA meetings in [facility]    PATIENT'S/FAMILY'S RESPONSE TO PLAN OF CARE: Patient interested in treatment resources available to him.   Thurston Hole  Gertie Fey Clinical Social Worker [phone])

## 2012-03-19 NOTE — Progress Notes (Signed)
Pt states "so you're saying I have to wait all day for this EEG and then they will let me go home tomorrow?  My girlfriend is about to leave so I think I'll just got when she does."  Pt made aware he would have to leave against medical advice. Dr. Karilyn Cota paged and made aware. Pt provided with AMA paper and made aware of diagnosis of seizures and risks involved in leaving including recurrent seizures and possible death. AMA paper signed and witnessed. Pt's IV site D/C'd and WNL. Pt's telemetry monitor d/c'd. Pt walked off floor with "girlfriend" in stable condition.

## 2012-03-19 NOTE — Progress Notes (Signed)
UR chart review completed.  

## 2012-03-19 NOTE — Progress Notes (Signed)
Subjective: This man was admitted yesterday with a couple of grand mal seizures. He was seen by neurology, Dr. Gerilyn Pilgrim who told me that the patient admitted to taking 15 tramadol tablets yesterday. This was most likely the cause of his seizures. He is now feeling well. An EEG has been ordered and is still pending.           Physical Exam: Blood pressure 143/89, pulse 86, temperature 97.9 F (36.6 C), temperature source Oral, resp. rate 20, height 6' (1.829 m), weight 87.1 kg (192 lb 0.3 oz), SpO2 97.00%. He looks systemically well. He is alert and orientated. There are no focal neurological signs. Heart sounds are present and normal without gallop rhythm. Lung fields are clear. Abdomen is soft and nontender. There are no signs of chronic liver disease.   Investigations:  No results found for this or any previous visit (from the past 240 hour(s)).   Basic Metabolic Panel:  Basename 03/19/12 0459 03/19/12 0124 03/18/12 2200  NA 140 -- 144  K 3.3* -- 2.8*  CL 105 -- 101  CO2 24 -- 15*  GLUCOSE 84 -- 111*  BUN 5* -- 8  CREATININE 1.04 -- 1.24  CALCIUM 8.4 -- 9.2  MG -- 2.3 --  PHOS -- -- --   Liver Function Tests:  [facility] 03/19/12 0124  AST 28  ALT 24  ALKPHOS 89  BILITOT 0.3  PROT 7.2  ALBUMIN 4.3     CBC:  Basename 03/19/12 0459 03/18/12 2200  WBC 11.9* 21.4*  NEUTROABS -- 15.9*  HGB 13.6 15.4  HCT 38.7* 45.6  MCV 94.6 98.3  PLT 226 283    Ct Head Wo Contrast  03/18/2012  *RADIOLOGY REPORT*  Clinical Data:  Progressively worsening seizure; left frontal scalp hematoma.  Concern for cervical spine injury.  CT HEAD WITHOUT CONTRAST AND CT CERVICAL SPINE WITHOUT CONTRAST  Technique:  Multidetector CT imaging of the head and cervical spine was performed following the standard protocol without intravenous contrast.  Multiplanar CT image reconstructions of the cervical spine were also generated.  Comparison: None  CT HEAD  Findings: There is no evidence  of acute infarction, mass lesion, or intra- or extra-axial hemorrhage on CT.  The posterior fossa, including the cerebellum, brainstem and fourth ventricle, is within normal limits.  The third and lateral ventricles, and basal ganglia are unremarkable in appearance.  The cerebral hemispheres are symmetric in appearance, with normal gray- white differentiation.  No mass effect or midline shift is seen.  There is no evidence of fracture; visualized osseous structures are unremarkable in appearance.  The visualized portions of the orbits are within normal limits.  The paranasal sinuses and mastoid air cells are well-aerated.  Mild soft tissue swelling is noted overlying the left frontal calvarium, and surrounding the left orbit.  IMPRESSION:  1.  No evidence of traumatic intracranial injury or fracture. 2.  Mild soft tissue swelling overlying the left frontal calvarium, and surrounding the left orbit.  CT CERVICAL SPINE  Findings: There is no evidence of fracture or subluxation. Vertebral bodies demonstrate normal height and alignment.  There is minimal narrowing of the intervertebral disc space at C5-C6, with associated anterior and posterior disc osteophyte complexes. Prevertebral soft tissues are within normal limits.  The visualized neural foramina are grossly unremarkable.  The thyroid gland is unremarkable in appearance.  The visualized lung apices are clear.  No significant soft tissue abnormalities are seen.  IMPRESSION:  1.  No evidence of  fracture or subluxation along the cervical spine.  2.  Minimal degenerative change at the mid cervical spine.   Original Report Authenticated By: Tonia Ghent, M.D.    Ct Cervical Spine Wo Contrast  03/18/2012  *RADIOLOGY REPORT*  Clinical Data:  Progressively worsening seizure; left frontal scalp hematoma.  Concern for cervical spine injury.  CT HEAD WITHOUT CONTRAST AND CT CERVICAL SPINE WITHOUT CONTRAST  Technique:  Multidetector CT imaging of the head and cervical  spine was performed following the standard protocol without intravenous contrast.  Multiplanar CT image reconstructions of the cervical spine were also generated.  Comparison: None  CT HEAD  Findings: There is no evidence of acute infarction, mass lesion, or intra- or extra-axial hemorrhage on CT.  The posterior fossa, including the cerebellum, brainstem and fourth ventricle, is within normal limits.  The third and lateral ventricles, and basal ganglia are unremarkable in appearance.  The cerebral hemispheres are symmetric in appearance, with normal gray- white differentiation.  No mass effect or midline shift is seen.  There is no evidence of fracture; visualized osseous structures are unremarkable in appearance.  The visualized portions of the orbits are within normal limits.  The paranasal sinuses and mastoid air cells are well-aerated.  Mild soft tissue swelling is noted overlying the left frontal calvarium, and surrounding the left orbit.  IMPRESSION:  1.  No evidence of traumatic intracranial injury or fracture. 2.  Mild soft tissue swelling overlying the left frontal calvarium, and surrounding the left orbit.  CT CERVICAL SPINE  Findings: There is no evidence of fracture or subluxation. Vertebral bodies demonstrate normal height and alignment.  There is minimal narrowing of the intervertebral disc space at C5-C6, with associated anterior and posterior disc osteophyte complexes. Prevertebral soft tissues are within normal limits.  The visualized neural foramina are grossly unremarkable.  The thyroid gland is unremarkable in appearance.  The visualized lung apices are clear.  No significant soft tissue abnormalities are seen.  IMPRESSION:  1.  No evidence of fracture or subluxation along the cervical spine.  2.  Minimal degenerative change at the mid cervical spine.   Original Report Authenticated By: Tonia Ghent, M.D.       Medications: I have reviewed the patient's current  medications.  Impression: 1. Seizure, likely secondary to tramadol toxicity. 2. Alcohol abuse. 3. No other abuse. 4. Anxiety and depression. 5. Hypokalemia.     Plan: 1. Await EEG. 2. Replete potassium. 3. Probable discharge home tomorrow, depending on the results of the EEG. I do not think he needs any maintenance anticonvulsants at the present time.     LOS: 1 day   Wilson Singer Pager [phone]  03/19/2012, 10:05 AM

## 2012-03-19 NOTE — Progress Notes (Signed)
Removed nicotine patch after insisting Dr Orvan Falconer order him one. States it "keeps him Awake". Placed patch back on arm. Reminded pt that he said he needed it badly. Went back into room and it was removed again. Laying on bedside table. Said he would put it back on in the morning. Had also pulled off telemetry. Removed gown and attempting to pull tape off IVs. Re taped and placed on telemetry. Reoriented to surroundings and circumstances. Bed alarm on. Changed from Low to moderate falls risk. Urine collected and sent to lab.

## 2012-03-19 NOTE — Consult Note (Signed)
[facility] Tuff Clabo A. Gerilyn Pilgrim, MD     [email or web address]          Joshua Dillon is an 26 y.o. male.   Assessment/Plan: 1. Single series of seizures likely due to tramadol/Ultram intoxication. It is also possible that alcohol withdrawal may also be contributing. The patient does not meet criteria for epilepsy and therefore I would discontinue the Dilantin. An EEG will also be obtained. Seizure precaution was recommended for now.  The patient is a 26 year old white male who presented with a grand mal seizure associated with unresponsiveness and amnesia. The patient had an event and this was followed by another similar event on route to the hospital. There is no previous history of seizures. There is no family history of seizures. Patient has a history of alcoholism. He reported to me that he did consume a 40 ounce bottle of alcohol about 10 AM on the day that he had his seizures. Seizure occurred last night several hours after his last alcohol consumption. The patient's alcohol screen is negative suggesting that this indeed could be due to alcohol withdrawal. However, the patient reportedly took several tablets of Ultram. He tells me that he took about 15 tablets all at once. He has abused Ultram previously but this is the first time that he has taken this many. The patient frankly admits that the Ultram gives him about this. He also indicates a long history of opioid abuse. He indicates however that he has not abused opioids and the long time. He indicates to me that he has taken many different types of opioids recreationally although he indicates that he has not done anything in the past 2 years.  The review of systems is carried out and essentially unremarkable other than stated above. He does complain of having a sore tongue which is undoubtedly due to his seizures.  GENERAL: A pleasant man in no acute distress.  HEENT: There is a left frontal laceration/bruise. There is also  evidence of tongue biting injury on both sides especially on the right.  ABDOMEN: soft  EXTREMITIES: No edema   BACK: Unremarkable.   SKIN: Normal by inspection.    MENTAL STATUS: Alert and oriented. Speech, language and cognition are generally intact. Judgment and insight normal.   CRANIAL NERVES: Pupils are equal, round and reactive to light and accomodation; extra ocular movements are full, there is no significant nystagmus; visual fields are full; upper and lower facial muscles are normal in strength and symmetric, there is no flattening of the nasolabial folds; tongue is midline; uvula is midline; shoulder elevation is normal.  MOTOR: Normal tone, bulk and strength; no pronator drift.  COORDINATION: Left finger to nose is normal, right finger to nose is normal, No rest tremor; no intention tremor; no postural tremor; no bradykinesia.  REFLEXES: Deep tendon reflexes are symmetrical and normal. Babinski reflexes are flexor bilaterally.   SENSATION: Normal to light touch.    Past Medical History  Diagnosis Date  . Depression   . ADHD (attention deficit hyperactivity disorder)   . Anxiety     History reviewed. No pertinent past surgical history.  Family History  Problem Relation Age of Onset  . Drug abuse Mother     narcotics  . Alcohol abuse Paternal Grandmother     Social History:  reports that he has been smoking Cigarettes.  He has a 2.4 pack-year smoking history. His smokeless tobacco use includes Snuff and Chew. He reports that he drinks about 16.5 ounces of alcohol per  week. He reports that he uses illicit drugs (Marijuana).  Allergies: No Known Allergies  Medications: Prior to Admission medications   Medication Sig Start Date End Date Taking? Authorizing Provider  Aspirin-Salicylamide-Caffeine (BC HEADACHE) 325-95-16 MG TABS Take 1 packet by mouth as needed.   Yes Historical Provider, MD  FLUoxetine (PROZAC) 40 MG capsule Take 40 mg by mouth 2 (two) times  daily.    Yes Historical Provider, MD    Scheduled Meds:   . FLUoxetine  40 mg Oral Daily  . folic acid  1 mg Oral Daily  . influenza  inactive virus vaccine  0.5 mL Intramuscular Once  . [COMPLETED] LORazepam      . LORazepam  0-4 mg Intravenous Q6H   Followed by  . LORazepam  0-4 mg Intravenous Q12H  . [COMPLETED] LORazepam  2 mg Intravenous Once  . multivitamin with minerals  1 tablet Oral Daily  . nicotine  21 mg Transdermal Daily  . [COMPLETED] phenytoin (DILANTIN) IV  1,000 mg Intravenous Once  . phenytoin  300 mg Oral QHS  . pneumococcal 23 valent vaccine  0.5 mL Intramuscular Once  . [COMPLETED] potassium chloride  10 mEq Intravenous Q1 Hr x 5  . [COMPLETED] banana bag IV fluid 1000 mL   Intravenous Once  . [COMPLETED] sodium chloride  1,000 mL Intravenous Once  . sodium chloride  3 mL Intravenous Q12H  . thiamine  100 mg Oral Daily   Or  . thiamine  100 mg Intravenous Daily  . [COMPLETED] LORazepam  1 mg Intravenous Once   Continuous Infusions:   . 0.9 % NaCl with KCl 20 mEq / L    . [DISCONTINUED] sodium chloride 0.9 % 1,000 mL with potassium chloride 20 mEq infusion     PRN Meds:.acetaminophen, bisacodyl, LORazepam, LORazepam, ondansetron (ZOFRAN) IV, polyethylene glycol, sodium phosphate   Blood pressure 144/96, pulse 92, temperature 97.9 F (36.6 C), temperature source Oral, resp. rate 20, height 6' (1.829 m), weight 87.1 kg (192 lb 0.3 oz), SpO2 98.00%.   Results for orders placed during the hospital encounter of 03/18/12 (from the past 48 hour(s))  CBC WITH DIFFERENTIAL     Status: Abnormal   Collection Time   03/18/12 10:00 PM      Component Value Range Comment   WBC 21.4 (*) 4.0 - 10.5 K/uL    RBC 4.64  4.22 - 5.81 MIL/uL    Hemoglobin 15.4  13.0 - 17.0 g/dL    HCT 16.1  09.6 - 04.5 %    MCV 98.3  78.0 - 100.0 fL    MCH 33.2  26.0 - 34.0 pg    MCHC 33.8  30.0 - 36.0 g/dL    RDW 40.9  81.1 - 91.4 %    Platelets 283  150 - 400 K/uL    Neutrophils  Relative 74  43 - 77 %    Neutro Abs 15.9 (*) 1.7 - 7.7 K/uL    Lymphocytes Relative 17  12 - 46 %    Lymphs Abs 3.5  0.7 - 4.0 K/uL    Monocytes Relative 9  3 - 12 %    Monocytes Absolute 1.9 (*) 0.1 - 1.0 K/uL    Eosinophils Relative 0  0 - 5 %    Eosinophils Absolute 0.0  0.0 - 0.7 K/uL    Basophils Relative 0  0 - 1 %    Basophils Absolute 0.0  0.0 - 0.1 K/uL   BASIC METABOLIC PANEL  Status: Abnormal   Collection Time   03/18/12 10:00 PM      Component Value Range Comment   Sodium 144  135 - 145 mEq/L    Potassium 2.8 (*) 3.5 - 5.1 mEq/L    Chloride 101  96 - 112 mEq/L    CO2 15 (*) 19 - 32 mEq/L    Glucose, Bld 111 (*) 70 - 99 mg/dL    BUN 8  6 - 23 mg/dL    Creatinine, Ser 4.09  0.50 - 1.35 mg/dL    Calcium 9.2  8.4 - 81.1 mg/dL    GFR calc non Af Amer 78 (*) >90 mL/min    GFR calc Af Amer >90  >90 mL/min   ETHANOL     Status: Normal   Collection Time   03/18/12 10:00 PM      Component Value Range Comment   Alcohol, Ethyl (B) <11  0 - 11 mg/dL   URINE RAPID DRUG SCREEN (HOSP PERFORMED)     Status: Abnormal   Collection Time   03/18/12 10:41 PM      Component Value Range Comment   Opiates NONE DETECTED  NONE DETECTED    Cocaine NONE DETECTED  NONE DETECTED    Benzodiazepines NONE DETECTED  NONE DETECTED    Amphetamines NONE DETECTED  NONE DETECTED    Tetrahydrocannabinol POSITIVE (*) NONE DETECTED    Barbiturates NONE DETECTED  NONE DETECTED   HEPATIC FUNCTION PANEL     Status: Abnormal   Collection Time   03/19/12  1:24 AM      Component Value Range Comment   Total Protein 7.2  6.0 - 8.3 g/dL    Albumin 4.3  3.5 - 5.2 g/dL    AST 28  0 - 37 U/L    ALT 24  0 - 53 U/L    Alkaline Phosphatase 89  39 - 117 U/L    Total Bilirubin 0.3  0.3 - 1.2 mg/dL    Bilirubin, Direct 0.1  0.0 - 0.3 mg/dL CORRECTED ON 91/47 AT 8295: PREVIOUSLY REPORTED AS <0.1   Indirect Bilirubin 0.2 (*) 0.3 - 0.9 mg/dL CORRECTED ON 62/13 AT 0865: PREVIOUSLY REPORTED AS NOT CALCULATED  MAGNESIUM      Status: Normal   Collection Time   03/19/12  1:24 AM      Component Value Range Comment   Magnesium 2.3  1.5 - 2.5 mg/dL   APTT     Status: Normal   Collection Time   03/19/12  1:24 AM      Component Value Range Comment   aPTT 29  24 - 37 seconds   PROTIME-INR     Status: Normal   Collection Time   03/19/12  1:24 AM      Component Value Range Comment   Prothrombin Time 14.4  11.6 - 15.2 seconds    INR 1.14  0.00 - 1.49   URINALYSIS, ROUTINE W REFLEX MICROSCOPIC     Status: Normal   Collection Time   03/19/12  4:46 AM      Component Value Range Comment   Color, Urine YELLOW  YELLOW    APPearance CLEAR  CLEAR    Specific Gravity, Urine 1.010  1.005 - 1.030    pH 6.5  5.0 - 8.0    Glucose, UA NEGATIVE  NEGATIVE mg/dL    Hgb urine dipstick NEGATIVE  NEGATIVE    Bilirubin Urine NEGATIVE  NEGATIVE    Ketones, ur NEGATIVE  NEGATIVE mg/dL  Protein, ur NEGATIVE  NEGATIVE mg/dL    Urobilinogen, UA 0.2  0.0 - 1.0 mg/dL    Nitrite NEGATIVE  NEGATIVE    Leukocytes, UA NEGATIVE  NEGATIVE MICROSCOPIC NOT DONE ON URINES WITH NEGATIVE PROTEIN, BLOOD, LEUKOCYTES, NITRITE, OR GLUCOSE <1000 mg/dL.  BASIC METABOLIC PANEL     Status: Abnormal   Collection Time   03/19/12  4:59 AM      Component Value Range Comment   Sodium 140  135 - 145 mEq/L    Potassium 3.3 (*) 3.5 - 5.1 mEq/L    Chloride 105  96 - 112 mEq/L    CO2 24  19 - 32 mEq/L    Glucose, Bld 84  70 - 99 mg/dL    BUN 5 (*) 6 - 23 mg/dL    Creatinine, Ser 4.09  0.50 - 1.35 mg/dL    Calcium 8.4  8.4 - 81.1 mg/dL    GFR calc non Af Amer >90  >90 mL/min    GFR calc Af Amer >90  >90 mL/min   CBC     Status: Abnormal   Collection Time   03/19/12  4:59 AM      Component Value Range Comment   WBC 11.9 (*) 4.0 - 10.5 K/uL    RBC 4.09 (*) 4.22 - 5.81 MIL/uL    Hemoglobin 13.6  13.0 - 17.0 g/dL    HCT 91.4 (*) 78.2 - 52.0 %    MCV 94.6  78.0 - 100.0 fL    MCH 33.3  26.0 - 34.0 pg    MCHC 35.1  30.0 - 36.0 g/dL    RDW 95.6  21.3 -  08.6 %    Platelets 226  150 - 400 K/uL   PHENYTOIN LEVEL, TOTAL     Status: Abnormal   Collection Time   03/19/12  4:59 AM      Component Value Range Comment   Phenytoin Lvl 9.6 (*) 10.0 - 20.0 ug/mL     Ct Head Wo Contrast  03/18/2012  *RADIOLOGY REPORT*  Clinical Data:  Progressively worsening seizure; left frontal scalp hematoma.  Concern for cervical spine injury.  CT HEAD WITHOUT CONTRAST AND CT CERVICAL SPINE WITHOUT CONTRAST  Technique:  Multidetector CT imaging of the head and cervical spine was performed following the standard protocol without intravenous contrast.  Multiplanar CT image reconstructions of the cervical spine were also generated.  Comparison: None  CT HEAD  Findings: There is no evidence of acute infarction, mass lesion, or intra- or extra-axial hemorrhage on CT.  The posterior fossa, including the cerebellum, brainstem and fourth ventricle, is within normal limits.  The third and lateral ventricles, and basal ganglia are unremarkable in appearance.  The cerebral hemispheres are symmetric in appearance, with normal gray- white differentiation.  No mass effect or midline shift is seen.  There is no evidence of fracture; visualized osseous structures are unremarkable in appearance.  The visualized portions of the orbits are within normal limits.  The paranasal sinuses and mastoid air cells are well-aerated.  Mild soft tissue swelling is noted overlying the left frontal calvarium, and surrounding the left orbit.  IMPRESSION:  1.  No evidence of traumatic intracranial injury or fracture. 2.  Mild soft tissue swelling overlying the left frontal calvarium, and surrounding the left orbit.  CT CERVICAL SPINE  Findings: There is no evidence of fracture or subluxation. Vertebral bodies demonstrate normal height and alignment.  There is minimal narrowing of the intervertebral disc space at C5-C6,  with associated anterior and posterior disc osteophyte complexes. Prevertebral soft tissues are  within normal limits.  The visualized neural foramina are grossly unremarkable.  The thyroid gland is unremarkable in appearance.  The visualized lung apices are clear.  No significant soft tissue abnormalities are seen.  IMPRESSION:  1.  No evidence of fracture or subluxation along the cervical spine.  2.  Minimal degenerative change at the mid cervical spine.   Original Report Authenticated By: Tonia Ghent, M.D.    Ct Cervical Spine Wo Contrast  03/18/2012  *RADIOLOGY REPORT*  Clinical Data:  Progressively worsening seizure; left frontal scalp hematoma.  Concern for cervical spine injury.  CT HEAD WITHOUT CONTRAST AND CT CERVICAL SPINE WITHOUT CONTRAST  Technique:  Multidetector CT imaging of the head and cervical spine was performed following the standard protocol without intravenous contrast.  Multiplanar CT image reconstructions of the cervical spine were also generated.  Comparison: None  CT HEAD  Findings: There is no evidence of acute infarction, mass lesion, or intra- or extra-axial hemorrhage on CT.  The posterior fossa, including the cerebellum, brainstem and fourth ventricle, is within normal limits.  The third and lateral ventricles, and basal ganglia are unremarkable in appearance.  The cerebral hemispheres are symmetric in appearance, with normal gray- white differentiation.  No mass effect or midline shift is seen.  There is no evidence of fracture; visualized osseous structures are unremarkable in appearance.  The visualized portions of the orbits are within normal limits.  The paranasal sinuses and mastoid air cells are well-aerated.  Mild soft tissue swelling is noted overlying the left frontal calvarium, and surrounding the left orbit.  IMPRESSION:  1.  No evidence of traumatic intracranial injury or fracture. 2.  Mild soft tissue swelling overlying the left frontal calvarium, and surrounding the left orbit.  CT CERVICAL SPINE  Findings: There is no evidence of fracture or subluxation.  Vertebral bodies demonstrate normal height and alignment.  There is minimal narrowing of the intervertebral disc space at C5-C6, with associated anterior and posterior disc osteophyte complexes. Prevertebral soft tissues are within normal limits.  The visualized neural foramina are grossly unremarkable.  The thyroid gland is unremarkable in appearance.  The visualized lung apices are clear.  No significant soft tissue abnormalities are seen.  IMPRESSION:  1.  No evidence of fracture or subluxation along the cervical spine.  2.  Minimal degenerative change at the mid cervical spine.   Original Report Authenticated By: Tonia Ghent, M.D.         Saumya Hukill A. Gerilyn Pilgrim, M.D.  Diplomate, Biomedical engineer of Psychiatry and Neurology ( Neurology). 03/19/2012, 8:12 AM

## 2012-03-19 NOTE — Care Management Note (Unsigned)
    Page 1 of 1   03/19/2012     1:48:23 PM   CARE MANAGEMENT NOTE 03/19/2012  Patient:  Joshua Dillon, Joshua Dillon   Account Number:  [id number]  Date Initiated:  03/19/2012  Documentation initiated by:  Sharrie Rothman  Subjective/Objective Assessment:   Pt admitted from home with seizures and etoh abuse. Pt lives with his girlfriend and will return home with her at discharge. Pt PCP is Dr. Reuel Boom in [place] and has seen him within the last month.     Action/Plan:   Will continue to follow for possible help with meds at discharge. CSW consulted for drug and etoh abuse. Financial counselor consulted for self pay status.   Anticipated DC Date:  03/20/2012   Anticipated DC Plan:  HOME/SELF CARE  In-house referral  Clinical Social Worker  Artist      DC Planning Services  CM consult      Choice offered to / List presented to:             Status of service:  In process, will continue to follow Medicare Important Message given?   (If response is "NO", the following Medicare IM given date fields will be blank) Date Medicare IM given:   Date Additional Medicare IM given:    Discharge Disposition:    Per UR Regulation:    If discussed at Long Length of Stay Meetings, dates discussed:    Comments:  03/19/12 1345 Arlyss Queen, RN BSN CM

## 2012-03-20 NOTE — Progress Notes (Signed)
Patient has decided to leave AGAINST MEDICAL ADVICE. He is aware of the risks of doing so against advice. He was supposed to have an EEG.

## 2012-03-22 ENCOUNTER — Encounter (HOSPITAL_COMMUNITY): Payer: Self-pay | Admitting: *Deleted

## 2012-03-22 ENCOUNTER — Emergency Department (HOSPITAL_COMMUNITY)
Admission: EM | Admit: 2012-03-22 | Discharge: 2012-03-23 | Payer: Self-pay | Attending: Emergency Medicine | Admitting: Emergency Medicine

## 2012-03-22 DIAGNOSIS — Z8659 Personal history of other mental and behavioral disorders: Secondary | ICD-10-CM | POA: Insufficient documentation

## 2012-03-22 DIAGNOSIS — F1010 Alcohol abuse, uncomplicated: Secondary | ICD-10-CM

## 2012-03-22 DIAGNOSIS — F1020 Alcohol dependence, uncomplicated: Secondary | ICD-10-CM

## 2012-03-22 DIAGNOSIS — F10229 Alcohol dependence with intoxication, unspecified: Secondary | ICD-10-CM | POA: Insufficient documentation

## 2012-03-22 DIAGNOSIS — Z720 Tobacco use: Secondary | ICD-10-CM

## 2012-03-22 DIAGNOSIS — G40909 Epilepsy, unspecified, not intractable, without status epilepticus: Secondary | ICD-10-CM | POA: Insufficient documentation

## 2012-03-22 DIAGNOSIS — R7402 Elevation of levels of lactic acid dehydrogenase (LDH): Secondary | ICD-10-CM | POA: Insufficient documentation

## 2012-03-22 DIAGNOSIS — Z7982 Long term (current) use of aspirin: Secondary | ICD-10-CM | POA: Insufficient documentation

## 2012-03-22 DIAGNOSIS — F1210 Cannabis abuse, uncomplicated: Secondary | ICD-10-CM | POA: Insufficient documentation

## 2012-03-22 DIAGNOSIS — F1021 Alcohol dependence, in remission: Secondary | ICD-10-CM | POA: Insufficient documentation

## 2012-03-22 DIAGNOSIS — F1290 Cannabis use, unspecified, uncomplicated: Secondary | ICD-10-CM | POA: Diagnosis present

## 2012-03-22 DIAGNOSIS — R209 Unspecified disturbances of skin sensation: Secondary | ICD-10-CM | POA: Insufficient documentation

## 2012-03-22 DIAGNOSIS — R000 Tachycardia, unspecified: Secondary | ICD-10-CM | POA: Insufficient documentation

## 2012-03-22 DIAGNOSIS — F17200 Nicotine dependence, unspecified, uncomplicated: Secondary | ICD-10-CM | POA: Insufficient documentation

## 2012-03-22 DIAGNOSIS — Z79899 Other long term (current) drug therapy: Secondary | ICD-10-CM | POA: Insufficient documentation

## 2012-03-22 DIAGNOSIS — R259 Unspecified abnormal involuntary movements: Secondary | ICD-10-CM | POA: Insufficient documentation

## 2012-03-22 DIAGNOSIS — F411 Generalized anxiety disorder: Secondary | ICD-10-CM | POA: Insufficient documentation

## 2012-03-22 DIAGNOSIS — R7401 Elevation of levels of liver transaminase levels: Secondary | ICD-10-CM

## 2012-03-22 HISTORY — DX: Alcohol dependence, uncomplicated: F10.20

## 2012-03-22 HISTORY — DX: Unspecified convulsions: R56.9

## 2012-03-22 LAB — COMPREHENSIVE METABOLIC PANEL
ALT: 52 U/L (ref 0–53)
AST: 127 U/L — ABNORMAL HIGH (ref 0–37)
Alkaline Phosphatase: 88 U/L (ref 39–117)
CO2: 28 mEq/L (ref 19–32)
Calcium: 9.7 mg/dL (ref 8.4–10.5)
Chloride: 100 mEq/L (ref 96–112)
GFR calc non Af Amer: >90 mL/min (ref >90)
Potassium: 3.8 mEq/L (ref 3.5–5.1)
Sodium: 138 mEq/L (ref 135–145)
Total Bilirubin: 0.3 mg/dL (ref 0.3–1.2)

## 2012-03-22 LAB — CBC WITH DIFFERENTIAL/PLATELET
Basophils Absolute: 0 10*3/uL (ref 0.0–0.1)
Lymphocytes Relative: 12 % (ref 12–46)
Lymphs Abs: 1.1 10*3/uL (ref 0.7–4.0)
Neutro Abs: 6.3 10*3/uL (ref 1.7–7.7)
Neutrophils Relative %: 72 % (ref 43–77)
Platelets: 224 10*3/uL (ref 150–400)
RBC: 4.35 MIL/uL (ref 4.22–5.81)
RDW: 12.3 % (ref 11.5–15.5)
WBC: 8.7 10*3/uL (ref 4.0–10.5)

## 2012-03-22 LAB — URINALYSIS, ROUTINE W REFLEX MICROSCOPIC
Bilirubin Urine: NEGATIVE
Hgb urine dipstick: NEGATIVE
Ketones, ur: NEGATIVE mg/dL
Specific Gravity, Urine: 1.025 (ref 1.005–1.030)
Urobilinogen, UA: 0.2 mg/dL (ref 0.0–1.0)

## 2012-03-22 LAB — ETHANOL: Alcohol, Ethyl (B): <11 mg/dL (ref 0–11)

## 2012-03-22 LAB — RAPID URINE DRUG SCREEN, HOSP PERFORMED
Barbiturates: NOT DETECTED
Cocaine: NOT DETECTED
Tetrahydrocannabinol: POSITIVE — AB

## 2012-03-22 LAB — MAGNESIUM: Magnesium: 1.8 mg/dL (ref 1.5–2.5)

## 2012-03-22 MED ORDER — ONDANSETRON HCL 4 MG PO TABS: 4.0000 mg | ORAL_TABLET | Freq: Three times a day (TID) | ORAL | Status: DC | PRN

## 2012-03-22 MED ORDER — NICOTINE 21 MG/24HR TD PT24
21.0000 mg | MEDICATED_PATCH | Freq: Every day | TRANSDERMAL | Status: DC
  Administered 2012-03-22: 21 mg via TRANSDERMAL
  Filled 2012-03-22: qty 1

## 2012-03-22 MED ORDER — ACETAMINOPHEN 325 MG PO TABS: 650.0000 mg | ORAL_TABLET | ORAL | Status: DC | PRN

## 2012-03-22 MED ORDER — ALUM & MAG HYDROXIDE-SIMETH 200-200-20 MG/5ML PO SUSP: 30.0000 mL | ORAL | Status: DC | PRN

## 2012-03-22 MED ORDER — IBUPROFEN 400 MG PO TABS: 600.0000 mg | ORAL_TABLET | Freq: Three times a day (TID) | ORAL | Status: DC | PRN

## 2012-03-22 MED ORDER — LORAZEPAM 1 MG PO TABS
1.0000 mg | ORAL_TABLET | Freq: Three times a day (TID) | ORAL | Status: DC | PRN
  Administered 2012-03-23: 1 mg via ORAL
  Filled 2012-03-22: qty 1

## 2012-03-22 MED ORDER — ZOLPIDEM TARTRATE 5 MG PO TABS
10.0000 mg | ORAL_TABLET | Freq: Every evening | ORAL | Status: DC | PRN
  Administered 2012-03-22: 10 mg via ORAL
  Filled 2012-03-22: qty 2

## 2012-03-22 MED ORDER — LORAZEPAM 2 MG/ML IJ SOLN
1.0000 mg | INTRAMUSCULAR | Status: DC | PRN
  Administered 2012-03-22 (×4): 1 mg via INTRAVENOUS
  Filled 2012-03-22 (×4): qty 1
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
## 2012-03-22 NOTE — ED Notes (Signed)
Patient is resting comfortably. 

## 2012-03-22 NOTE — Consult Note (Signed)
[facility] History and Physical  Joshua Dillon JYN:[id number] DOB: 21-Mar-1986 DOA: 03/22/2012  Referring physician: Devoria Albe, MD PCP: No primary provider on file.  Reason for consultation: Patient desires assistance with alcohol sobriety. He wants to enroll  in a 28 day program to treat his alcohol abuse.  Chief Complaint: Request for assistance with treating alcohol abuse.  HPI: Joshua Dillon is a 26 y.o. male with a history significant for alcoholism, ADHD, and bipolar disorder, who presents to the emergency department today with a request for treatment for his alcoholism. The patient was recently hospitalized earlier this week for a seizure. Initially, it was thought that he had an alcohol withdrawal seizure, but upon further questioning and evaluation, it was believed by both Dr. Karilyn Cota and by Dr. Gerilyn Pilgrim that the patient's seizure was secondary to tramadol. Apparently, during the previous hospitalization, the patient disclosed that he had taken 15 tramadol tablets the day before trying to get a "buzz". One of the most common side effects of tramadol his seizures. Prior to that seizure, he had no prior history of seizures associated with alcohol use or abuse or cessation from alcohol for any period of time. He presents today primarily because of his desire to quit drinking, but he realizes he needs assistance to do it. He was sent to the ED by [facility] for evaluation. It was reported that the patient was complaining of withdrawal symptoms, however, he denies chest pain, sweatiness, shakiness jitteriness, nausea, vomiting, or dizziness. He transiently felt a crawling sensation on his skin, but he thought this was secondary from having a hangover. His last alcoholic beverage was early this morning.  Currently, the patient is afebrile and hemodynamically stable. He is mildly hypertensive with a blood pressure 151/55. He is oxygenating 100% on room air. His heart rate is well within  normal limits. His lab data are virtually unremarkable with the exception of an AST of 127 and normal ALT, consistent with alcohol abuse. His alcohol level is less than 11. His urine drug screen is positive for benzodiazepines and THC.  Review of Systems: As above in history present illness, otherwise negative.  Past Medical History  Diagnosis Date  . Depression   . ADHD (attention deficit hyperactivity disorder)   . Anxiety   . Alcoholism   . Seizure 03/2012    Secondary to tramadol.   History reviewed. No pertinent past surgical history. Social History: He is single. He has no children. He lives in [place]. 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 On a typical day, he drinks a 12 pack of beer and a small bottle of whiskey. However, he does not drink every day. He's gone at least a week without drinking before. He acknowledges that he binge drinks. He smokes a half a pack of cigarettes per day. He also uses smokeless tobacco daily. He smokes marijuana about once or twice weekly. He denies any other illicit drug use.   No Known Allergies  Family History  Problem Relation Age of Onset  . Drug abuse Mother     narcotics  . Alcohol abuse Paternal Grandmother   Family history: Positive for multiple relatives with alcohol abuse. His mother is 15 years of age and has bipolar disorder. His father is 71 years of age and has depression.  Prior to Admission medications   Medication Sig Start Date End Date Taking? Authorizing Provider  Aspirin-Salicylamide-Caffeine (BC HEADACHE) 325-95-16 MG TABS Take 1 packet by mouth as needed.   Yes  Historical Provider, MD  FLUoxetine (PROZAC) 40 MG capsule Take 40 mg by mouth 2 (two) times daily.    Yes Historical Provider, MD   Physical Exam: Filed Vitals:   03/22/12 1411  BP: 151/55  Pulse: 85  Temp: 98 F (36.7 C)  TempSrc: Oral  Resp: 18  Height: 6' (1.829 m)  Weight: 90.719 kg (200 lb)  SpO2: 100%     General:  Alert  pleasant 26 year old Caucasian man sitting up in bed, in no acute distress.  Face bruising and ecchymosis on the left face and periorbitally (from fall from previous seizure several days ago). No active bleeding.  Eyes: Pupils are equal, round, and reactive to light. Extraocular was are intact. Conjunctivae are clear. Sclerae are white.  ENT: Oropharynx reveals mildly dry mucous membranes. No posterior exudates or erythema.  Neck: Supple, no adenopathy, no thyromegaly, no JVD.  Cardiovascular: S1, S2, with no murmurs rubs or gallops.  Respiratory: Clear to auscultation bilaterally.  Abdomen: Positive bowel sounds, soft, nontender, nondistended.  Skin: Ecchymosis on the left face as noted above. Multiple colored and noncolored tattoos on his arms. Overall good turgor.  Musculoskeletal: No acute hot red joints. Muscle tone within normal limits. No pedal edema. Pedal pulses palpable.  Psychiatric: He is alert and oriented x3. He denies suicidal ideation. He denies homicidal ideation. He is not tremulous. His affect is actually pleasant. His speech is clear. He has a clear plan of getting treatment for his alcoholism.  Neurologic: Cranial nerves II through XII are intact. Strength is 5 over 5 throughout. Sensation is intact throughout.  Labs on Admission:  Basic Metabolic Panel:  Lab 03/22/12 4696 03/19/12 0459 03/19/12 0124 03/18/12 2200  NA 138 140 -- 144  K 3.8 3.3* -- 2.8*  CL 100 105 -- 101  CO2 28 24 -- 15*  GLUCOSE 104* 84 -- 111*  BUN 8 5* -- 8  CREATININE 1.05 1.04 -- 1.24  CALCIUM 9.7 8.4 -- 9.2  MG 1.8 -- 2.3 --  PHOS -- -- -- --   Liver Function Tests:  Lab 03/22/12 1452 03/19/12 0124  AST 127* 28  ALT 52 24  ALKPHOS 88 89  BILITOT 0.3 0.3  PROT 6.9 7.2  ALBUMIN 3.8 4.3   No results found for this basename: LIPASE:5,AMYLASE:5 in the last 168 hours No results found for this basename: AMMONIA:5 in the last 168 hours CBC:  Lab 03/22/12 1452 03/19/12 0459  03/18/12 2200  WBC 8.7 11.9* 21.4*  NEUTROABS 6.3 -- 15.9*  HGB 14.4 13.6 15.4  HCT 40.9 38.7* 45.6  MCV 94.0 94.6 98.3  PLT 224 226 283   Cardiac Enzymes: No results found for this basename: CKTOTAL:5,CKMB:5,CKMBINDEX:5,TROPONINI:5 in the last 168 hours  BNP (last 3 results) No results found for this basename: PROBNP:3 in the last 8760 hours CBG: No results found for this basename: GLUCAP:5 in the last 168 hours  Radiological Exams on Admission: No results found.  EKG: I  Assessment/Plan Principal Problem:  *Alcohol abuse Active Problems:  Marijuana use  Elevated AST (SGOT)  Smokeless tobacco use   1. This is a 26 year old man with a history of ADHD, depression, and anxiety, who presents for treatment of alcoholism. The patient demonstrates no signs or symptoms consistent with alcohol withdrawal syndrome. He is hemodynamically stable. Neither is he tachycardic nor does he have an accelerated blood pressure. His speech is clear. His thought processes are appropriate. He is not homicidal or suicidal. He simply desires treatment for his  alcoholism. His AST is elevated, consistent with alcohol effects on the liver. He smokes tobacco and and marijuana, both of which he was encouraged to stop. The does not require medical admission to the hospital as there appears to be no medical reason or etiology to admit him. He desires inpatient treatment in a facility that would augment his ability to stay sober.    Recommendations: 1. Consult ACT team for assistance with inpatient treatment for alcoholism. 2. Gentle IV fluids. 3. The patient is medically cleared for further treatment in a psychiatric or substance rehabilitation center.    Time spent: 45 minutes  Joshua Dillon [facility]   If 7PM-7AM, please contact night-coverage [email or web address] Password [facility] 03/22/2012, 6:38 PM
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
## 2012-03-22 NOTE — ED Notes (Signed)
act team completed his assessment

## 2012-03-22 NOTE — ED Notes (Signed)
Pt states withdrawal from etoh, last etoh was 5 pm.    Recent adm and left AMA. For same.  Sent here from [facility].  Tues had a sz.x2.

## 2012-03-22 NOTE — ED Notes (Signed)
Patient does not need anything at this time. 

## 2012-03-22 NOTE — BH Assessment (Signed)
[facility] Assessment Progress Note      03/22/12 2300.  Pt accepted to [facility], [place] to Dr Dub Mikes, [phone].  All paperwork completed. Daleen Squibb, LCSWA

## 2012-03-23 ENCOUNTER — Inpatient Hospital Stay (HOSPITAL_COMMUNITY)
Admission: AD | Admit: 2012-03-23 | Discharge: 2012-03-28 | DRG: 897 | Disposition: A | Discharge status: Home / Self Care | Payer: 59 | Source: Ambulatory Visit | Attending: Psychiatry | Admitting: Psychiatry

## 2012-03-23 ENCOUNTER — Encounter (HOSPITAL_COMMUNITY): Payer: Self-pay

## 2012-03-23 DIAGNOSIS — F909 Attention-deficit hyperactivity disorder, unspecified type: Secondary | ICD-10-CM

## 2012-03-23 DIAGNOSIS — F39 Unspecified mood [affective] disorder: Secondary | ICD-10-CM | POA: Diagnosis present

## 2012-03-23 DIAGNOSIS — F329 Major depressive disorder, single episode, unspecified: Secondary | ICD-10-CM | POA: Diagnosis present

## 2012-03-23 DIAGNOSIS — R569 Unspecified convulsions: Secondary | ICD-10-CM

## 2012-03-23 DIAGNOSIS — F3289 Other specified depressive episodes: Secondary | ICD-10-CM | POA: Diagnosis present

## 2012-03-23 DIAGNOSIS — R7401 Elevation of levels of liver transaminase levels: Secondary | ICD-10-CM

## 2012-03-23 DIAGNOSIS — Z23 Encounter for immunization: Secondary | ICD-10-CM

## 2012-03-23 DIAGNOSIS — F32A Depression, unspecified: Secondary | ICD-10-CM

## 2012-03-23 DIAGNOSIS — F411 Generalized anxiety disorder: Secondary | ICD-10-CM

## 2012-03-23 DIAGNOSIS — F1020 Alcohol dependence, uncomplicated: Principal | ICD-10-CM | POA: Diagnosis present

## 2012-03-23 DIAGNOSIS — F419 Anxiety disorder, unspecified: Secondary | ICD-10-CM

## 2012-03-23 DIAGNOSIS — F1994 Other psychoactive substance use, unspecified with psychoactive substance-induced mood disorder: Secondary | ICD-10-CM | POA: Diagnosis present

## 2012-03-23 DIAGNOSIS — E876 Hypokalemia: Secondary | ICD-10-CM

## 2012-03-23 DIAGNOSIS — Z720 Tobacco use: Secondary | ICD-10-CM

## 2012-03-23 DIAGNOSIS — F17200 Nicotine dependence, unspecified, uncomplicated: Secondary | ICD-10-CM | POA: Diagnosis present

## 2012-03-23 DIAGNOSIS — F1010 Alcohol abuse, uncomplicated: Secondary | ICD-10-CM

## 2012-03-23 DIAGNOSIS — F1210 Cannabis abuse, uncomplicated: Secondary | ICD-10-CM | POA: Diagnosis present

## 2012-03-23 DIAGNOSIS — F1290 Cannabis use, unspecified, uncomplicated: Secondary | ICD-10-CM

## 2012-03-23 MED ORDER — CHLORDIAZEPOXIDE HCL 25 MG PO CAPS
25.0000 mg | ORAL_CAPSULE | Freq: Four times a day (QID) | ORAL | Status: AC | PRN
  Administered 2012-03-23 – 2012-03-25 (×5): 25 mg via ORAL
  Filled 2012-03-23 (×4): qty 1

## 2012-03-23 MED ORDER — NICOTINE 21 MG/24HR TD PT24
21.0000 mg | MEDICATED_PATCH | Freq: Every day | TRANSDERMAL | Status: DC
  Administered 2012-03-23 – 2012-03-25 (×3): 21 mg via TRANSDERMAL
  Filled 2012-03-23 (×5): qty 1

## 2012-03-23 MED ORDER — ALUM & MAG HYDROXIDE-SIMETH 200-200-20 MG/5ML PO SUSP: 30.0000 mL | ORAL | Status: DC | PRN

## 2012-03-23 MED ORDER — HYDROXYZINE HCL 25 MG PO TABS
25.0000 mg | ORAL_TABLET | Freq: Four times a day (QID) | ORAL | Status: AC | PRN
  Administered 2012-03-24 (×2): 25 mg via ORAL

## 2012-03-23 MED ORDER — ADULT MULTIVITAMIN W/MINERALS CH
1.0000 | ORAL_TABLET | Freq: Every day | ORAL | Status: DC
  Administered 2012-03-23 – 2012-03-28 (×6): 1 via ORAL
  Filled 2012-03-23 (×5): qty 1
  Filled 2012-03-23: qty 14
  Filled 2012-03-23 (×2): qty 1

## 2012-03-23 MED ORDER — PANTOPRAZOLE SODIUM 20 MG PO TBEC
20.0000 mg | DELAYED_RELEASE_TABLET | Freq: Every day | ORAL | Status: DC
  Administered 2012-03-23 – 2012-03-28 (×6): 20 mg via ORAL
  Filled 2012-03-23 (×5): qty 1
  Filled 2012-03-23: qty 14
  Filled 2012-03-23 (×3): qty 1

## 2012-03-23 MED ORDER — FLUOXETINE HCL 20 MG PO CAPS
40.0000 mg | ORAL_CAPSULE | Freq: Two times a day (BID) | ORAL | Status: DC
  Administered 2012-03-23: 40 mg via ORAL
  Filled 2012-03-23 (×5): qty 2

## 2012-03-23 MED ORDER — ACETAMINOPHEN 325 MG PO TABS
650.0000 mg | ORAL_TABLET | Freq: Four times a day (QID) | ORAL | Status: DC | PRN
  Administered 2012-03-26: 650 mg via ORAL

## 2012-03-23 MED ORDER — CHLORDIAZEPOXIDE HCL 25 MG PO CAPS
25.0000 mg | ORAL_CAPSULE | Freq: Four times a day (QID) | ORAL | Status: AC
  Administered 2012-03-23 – 2012-03-24 (×7): 25 mg via ORAL
  Filled 2012-03-23 (×8): qty 1

## 2012-03-23 MED ORDER — FLUOXETINE HCL 20 MG PO CAPS
40.0000 mg | ORAL_CAPSULE | Freq: Two times a day (BID) | ORAL | Status: DC
  Administered 2012-03-23 – 2012-03-28 (×10): 40 mg via ORAL
  Filled 2012-03-23 (×9): qty 2
  Filled 2012-03-23: qty 28
  Filled 2012-03-23 (×4): qty 2
  Filled 2012-03-23: qty 28
  Filled 2012-03-23 (×2): qty 2

## 2012-03-23 MED ORDER — CHLORDIAZEPOXIDE HCL 25 MG PO CAPS
25.0000 mg | ORAL_CAPSULE | Freq: Three times a day (TID) | ORAL | Status: AC
  Administered 2012-03-25 (×3): 25 mg via ORAL
  Filled 2012-03-23 (×3): qty 1

## 2012-03-23 MED ORDER — THIAMINE HCL 100 MG/ML IJ SOLN: 100.0000 mg | Freq: Once | INTRAMUSCULAR | Status: DC

## 2012-03-23 MED ORDER — LOPERAMIDE HCL 2 MG PO CAPS: 2.0000 mg | ORAL_CAPSULE | ORAL | Status: AC | PRN

## 2012-03-23 MED ORDER — VITAMIN B-1 100 MG PO TABS
100.0000 mg | ORAL_TABLET | Freq: Every day | ORAL | Status: DC
  Administered 2012-03-24 – 2012-03-28 (×5): 100 mg via ORAL
  Filled 2012-03-23 (×7): qty 1

## 2012-03-23 MED ORDER — MAGNESIUM HYDROXIDE 400 MG/5ML PO SUSP: 30.0000 mL | Freq: Every day | ORAL | Status: DC | PRN

## 2012-03-23 MED ORDER — TRAZODONE HCL 50 MG PO TABS
50.0000 mg | ORAL_TABLET | Freq: Every evening | ORAL | Status: DC | PRN
  Administered 2012-03-23 – 2012-03-27 (×9): 50 mg via ORAL
  Filled 2012-03-23 (×9): qty 1
  Filled 2012-03-23: qty 28

## 2012-03-23 MED ORDER — CHLORDIAZEPOXIDE HCL 25 MG PO CAPS
25.0000 mg | ORAL_CAPSULE | Freq: Every day | ORAL | Status: AC
  Administered 2012-03-27: 25 mg via ORAL
  Filled 2012-03-23: qty 1

## 2012-03-23 MED ORDER — ONDANSETRON 4 MG PO TBDP: 4.0000 mg | ORAL_TABLET | Freq: Four times a day (QID) | ORAL | Status: AC | PRN

## 2012-03-23 MED ORDER — CHLORDIAZEPOXIDE HCL 25 MG PO CAPS
25.0000 mg | ORAL_CAPSULE | ORAL | Status: AC
  Administered 2012-03-26 (×2): 25 mg via ORAL
  Filled 2012-03-23 (×2): qty 1

## 2012-03-23 NOTE — H&P (Signed)
Psychiatric Admission Assessment Adult  Patient Identification:  Khy Pitre Date of Evaluation:  03/23/2012 Chief Complaint:  Alcohol Dependence 303.90 "I just want to stop drinking" History of Present Illness:: Tynan reports that he began drinking about 2 years ago, and has been ingesting about 200 ounces of beer daily. He denies that he is a daily drinker. He reports that he developed anxiety while in the [employer] about 7 years ago, and was started on Prozac and Klonopin at that time. He denies use of other substances, but his urine drug screen is positive for cannabis. When asked about that he stated "it's not a regular thing,"  and he last used about one week ago. He denies suicidal or homicidal ideation. He denies auditory or visual hallucinations. He does endorse some mild paranoia and states that he feels like someone is watching him.  He has experienced this since he was in the [employer]. He denies any history of traumatic events. Elements:  Location:  Inpatient. Quality:  Acute. Severity:  Severe. Timing:  Chronic. Duration:  2 years. Associated Signs/Synptoms: Depression Symptoms:  depressed mood, feelings of worthlessness/guilt, anxiety, loss of energy/fatigue, (Hypo) Manic Symptoms:  None Anxiety Symptoms:  Excessive Worry, Social Anxiety, Psychotic Symptoms:  Paranoia, PTSD Symptoms: Denies  Psychiatric Specialty Exam: Physical Exam  Constitutional: He is oriented to person, place, and time. He appears well-developed and well-nourished.  HENT:  Head: Normocephalic and atraumatic.  Eyes: Pupils are equal, round, and reactive to light.  Neck: Normal range of motion. No tracheal deviation present.  Musculoskeletal: Normal range of motion.  Neurological: He is alert and oriented to person, place, and time.  Skin: Skin is warm and dry. There is erythema (Bruising and erythema on left orbit   ).  Psychiatric: His speech is normal. His mood appears anxious. He exhibits a  depressed mood.    Review of Systems  Constitutional: Negative.   HENT: Negative for hearing loss, ear pain, congestion, sore throat and tinnitus.   Eyes: Positive for blurred vision (nearsighted in right eye). Negative for double vision and photophobia.  Respiratory: Negative.   Cardiovascular: Negative.   Gastrointestinal: Positive for heartburn, abdominal pain and diarrhea.  Genitourinary: Negative.   Musculoskeletal: Positive for falls.  Skin:       Bruising on left eye  Neurological: Positive for seizures and headaches.  Endo/Heme/Allergies: Negative.   Psychiatric/Behavioral: Positive for depression and substance abuse. Negative for suicidal ideas and hallucinations. The patient is nervous/anxious. The patient does not have insomnia.     Blood pressure 160/96, pulse 80, temperature 98.4 F (36.9 C), temperature source Oral, resp. rate 18, height 5\' 11"  (1.803 m), weight 88.905 kg (196 lb).Body mass index is 27.34 kg/(m^2).  General Appearance: Disheveled  Eye Contact::  Minimal  Speech:  Clear and Coherent  Volume:  Normal  Mood:  Anxious and Dysphoric  Affect:  Congruent  Thought Process:  Linear  Orientation:  Full (Time, Place, and Person)  Thought Content:  Paranoid Ideation  Suicidal Thoughts:  No  Homicidal Thoughts:  No  Memory:  Immediate;   Good Recent;   Good Remote;   Good  Judgement:  Fair  Insight:  Lacking  Psychomotor Activity:  Normal  Concentration:  Good  Recall:  Good  Akathisia:  No  Handed:    AIMS (if indicated):     Assets:  Communication Skills Desire for Improvement Housing Vocational/Educational  Sleep:       Past Psychiatric History: Diagnosis:  Hospitalizations:  Outpatient Care:  Substance Abuse Care:  Self-Mutilation:  Suicidal Attempts:  Violent Behaviors:   Past Medical History:   Past Medical History  Diagnosis Date  . Depression   . ADHD (attention deficit hyperactivity disorder)   . Anxiety   . Alcoholism   .  Seizure 03/2012    Secondary to tramadol.   Seizure History:  One incidence, likely withdrawal Allergies:  No Known Allergies PTA Medications: Prescriptions prior to admission  Medication Sig Dispense Refill  . Aspirin-Salicylamide-Caffeine (BC HEADACHE) 325-95-16 MG TABS Take 1 packet by mouth as needed.      Marland Kitchen FLUoxetine (PROZAC) 40 MG capsule Take 40 mg by mouth 2 (two) times daily.         Previous Psychotropic Medications:  Medication/Dose  Prozac   Klonopin              Substance Abuse History in the last 12 months:  yes  Consequences of Substance Abuse: Medical Consequences:  Multiple falls, seizure Withdrawal Symptoms:   Cramps Diaphoresis Diarrhea Headaches Nausea Tremors Vomiting  Social History:  reports that he has been smoking Cigarettes.  He has a 2.4 pack-year smoking history. His smokeless tobacco use includes Snuff and Chew. He reports that he drinks about 16.5 ounces of alcohol per week. He reports that he uses illicit drugs (Marijuana and Benzodiazepines) about 7 times per week. Additional Social History:   Name of Substance 1: ETOH                  Current Place of Residence:  Lives alone in [place] Place of Birth:  [place]  Family Members: Marital Status:  Single Children: None Relationships: Education:  Corporate treasurer Problems/Performance: Religious Beliefs/Practices: Attends a [employer] for social interaction History of Abuse denies Armed forces technical officer; Hotel manager History:  Electronics engineer History: None Hobbies/Interests: None Family History:   Family History  Problem Relation Age of Onset  . Drug abuse Mother     narcotics  . Alcohol abuse Paternal Grandmother   . Anxiety disorder Father   . Bipolar disorder Mother     Results for orders placed during the hospital encounter of 03/22/12 (from the past 72 hour(s))  ETHANOL     Status: Normal   Collection Time   03/22/12  2:52 PM       Component Value Range Comment   Alcohol, Ethyl (B) <11  0 - 11 mg/dL   CBC WITH DIFFERENTIAL     Status: Abnormal   Collection Time   03/22/12  2:52 PM      Component Value Range Comment   WBC 8.7  4.0 - 10.5 K/uL    RBC 4.35  4.22 - 5.81 MIL/uL    Hemoglobin 14.4  13.0 - 17.0 g/dL    HCT 16.1  09.6 - 04.5 %    MCV 94.0  78.0 - 100.0 fL    MCH 33.1  26.0 - 34.0 pg    MCHC 35.2  30.0 - 36.0 g/dL    RDW 40.9  81.1 - 91.4 %    Platelets 224  150 - 400 K/uL    Neutrophils Relative 72  43 - 77 %    Neutro Abs 6.3  1.7 - 7.7 K/uL    Lymphocytes Relative 12  12 - 46 %    Lymphs Abs 1.1  0.7 - 4.0 K/uL    Monocytes Relative 15 (*) 3 - 12 %    Monocytes Absolute 1.3 (*) 0.1 - 1.0 K/uL  Eosinophils Relative 0  0 - 5 %    Eosinophils Absolute 0.0  0.0 - 0.7 K/uL    Basophils Relative 0  0 - 1 %    Basophils Absolute 0.0  0.0 - 0.1 K/uL   COMPREHENSIVE METABOLIC PANEL     Status: Abnormal   Collection Time   03/22/12  2:52 PM      Component Value Range Comment   Sodium 138  135 - 145 mEq/L    Potassium 3.8  3.5 - 5.1 mEq/L    Chloride 100  96 - 112 mEq/L    CO2 28  19 - 32 mEq/L    Glucose, Bld 104 (*) 70 - 99 mg/dL    BUN 8  6 - 23 mg/dL    Creatinine, Ser 4.09  0.50 - 1.35 mg/dL    Calcium 9.7  8.4 - 81.1 mg/dL    Total Protein 6.9  6.0 - 8.3 g/dL    Albumin 3.8  3.5 - 5.2 g/dL    AST 914 (*) 0 - 37 U/L    ALT 52  0 - 53 U/L    Alkaline Phosphatase 88  39 - 117 U/L    Total Bilirubin 0.3  0.3 - 1.2 mg/dL    GFR calc non Af Amer >90  >90 mL/min    GFR calc Af Amer >90  >90 mL/min   MAGNESIUM     Status: Normal   Collection Time   03/22/12  2:52 PM      Component Value Range Comment   Magnesium 1.8  1.5 - 2.5 mg/dL   URINALYSIS, ROUTINE W REFLEX MICROSCOPIC     Status: Normal   Collection Time   03/22/12  3:55 PM      Component Value Range Comment   Color, Urine YELLOW  YELLOW    APPearance CLEAR  CLEAR    Specific Gravity, Urine 1.025  1.005 - 1.030    pH 6.5  5.0 - 8.0     Glucose, UA NEGATIVE  NEGATIVE mg/dL    Hgb urine dipstick NEGATIVE  NEGATIVE    Bilirubin Urine NEGATIVE  NEGATIVE    Ketones, ur NEGATIVE  NEGATIVE mg/dL    Protein, ur NEGATIVE  NEGATIVE mg/dL    Urobilinogen, UA 0.2  0.0 - 1.0 mg/dL    Nitrite NEGATIVE  NEGATIVE    Leukocytes, UA NEGATIVE  NEGATIVE MICROSCOPIC NOT DONE ON URINES WITH NEGATIVE PROTEIN, BLOOD, LEUKOCYTES, NITRITE, OR GLUCOSE <1000 mg/dL.  URINE RAPID DRUG SCREEN (HOSP PERFORMED)     Status: Abnormal   Collection Time   03/22/12  3:55 PM      Component Value Range Comment   Opiates NONE DETECTED  NONE DETECTED    Cocaine NONE DETECTED  NONE DETECTED    Benzodiazepines POSITIVE (*) NONE DETECTED    Amphetamines NONE DETECTED  NONE DETECTED    Tetrahydrocannabinol POSITIVE (*) NONE DETECTED    Barbiturates NONE DETECTED  NONE DETECTED    Psychological Evaluations:  Assessment:   AXIS I:  Generalized Anxiety Disorder; alcohol use disorder, severe AXIS II:  Deferred AXIS III:   Past Medical History  Diagnosis Date  . Depression   . ADHD (attention deficit hyperactivity disorder)   . Anxiety   . Alcoholism   . Seizure 03/2012    Secondary to tramadol.   AXIS IV:  problems with access to health care services AXIS V:  41-50 serious symptoms  Treatment Plan/Recommendations:  We will complete a safe  medical detox, and consider medication changes to treat his anxiety. If he is amenable, we will refer him for outpatient or residential treatment for substance abuse.  Treatment Plan Summary: Daily contact with patient to assess and evaluate symptoms and progress in treatment Medication management Current Medications:  Current Facility-Administered Medications  Medication Dose Route Frequency Provider Last Rate Last Dose  . acetaminophen (TYLENOL) tablet 650 mg  650 mg Oral Q6H PRN Sanjuana Kava, NP      . alum & mag hydroxide-simeth (MAALOX/MYLANTA) 200-200-20 MG/5ML suspension 30 mL  30 mL Oral Q4H PRN Sanjuana Kava, NP      . chlordiazePOXIDE (LIBRIUM) capsule 25 mg  25 mg Oral Q6H PRN Sanjuana Kava, NP   25 mg at 03/23/12 1042  . chlordiazePOXIDE (LIBRIUM) capsule 25 mg  25 mg Oral QID Sanjuana Kava, NP       Followed by  . chlordiazePOXIDE (LIBRIUM) capsule 25 mg  25 mg Oral TID Sanjuana Kava, NP       Followed by  . chlordiazePOXIDE (LIBRIUM) capsule 25 mg  25 mg Oral BH-qamhs Sanjuana Kava, NP       Followed by  . chlordiazePOXIDE (LIBRIUM) capsule 25 mg  25 mg Oral Daily Sanjuana Kava, NP      . FLUoxetine (PROZAC) capsule 40 mg  40 mg Oral BID Sanjuana Kava, NP   40 mg at 03/23/12 1041  . hydrOXYzine (ATARAX/VISTARIL) tablet 25 mg  25 mg Oral Q6H PRN Sanjuana Kava, NP      . loperamide (IMODIUM) capsule 2-4 mg  2-4 mg Oral PRN Sanjuana Kava, NP      . magnesium hydroxide (MILK OF MAGNESIA) suspension 30 mL  30 mL Oral Daily PRN Sanjuana Kava, NP      . multivitamin with minerals tablet 1 tablet  1 tablet Oral Daily Sanjuana Kava, NP      . nicotine (NICODERM CQ - dosed in mg/24 hours) patch 21 mg  21 mg Transdermal Q0600 Sanjuana Kava, NP      . ondansetron (ZOFRAN-ODT) disintegrating tablet 4 mg  4 mg Oral Q6H PRN Sanjuana Kava, NP      . pantoprazole (PROTONIX) EC tablet 20 mg  20 mg Oral Daily Jorje Guild, PA-C      . thiamine (B-1) injection 100 mg  100 mg Intramuscular Once Sanjuana Kava, NP      . thiamine (VITAMIN B-1) tablet 100 mg  100 mg Oral Daily Sanjuana Kava, NP      . traZODone (DESYREL) tablet 50 mg  50 mg Oral QHS PRN,MR X 1 Jorje Guild, PA-C       Facility-Administered Medications Ordered in Other Encounters  Medication Dose Route Frequency Provider Last Rate Last Dose  . [DISCONTINUED] acetaminophen (TYLENOL) tablet 650 mg  650 mg Oral Q4H PRN Ward Givens, MD      . [DISCONTINUED] alum & mag hydroxide-simeth (MAALOX/MYLANTA) 200-200-20 MG/5ML suspension 30 mL  30 mL Oral PRN Ward Givens, MD      . [DISCONTINUED] ibuprofen (ADVIL,MOTRIN) tablet 600 mg  600 mg Oral Q8H PRN Ward Givens, MD      . [DISCONTINUED] LORazepam (ATIVAN) injection 1 mg  1 mg Intravenous Q2H PRN Ward Givens, MD   1 mg at 03/22/12 2328  . [DISCONTINUED] LORazepam (ATIVAN) tablet 1 mg  1 mg Oral Q8H PRN Ward Givens, MD   1  mg at 03/23/12 0450  . [DISCONTINUED] nicotine (NICODERM CQ - dosed in mg/24 hours) patch 21 mg  21 mg Transdermal Daily Ward Givens, MD   21 mg at 03/22/12 2059  . [DISCONTINUED] ondansetron (ZOFRAN) tablet 4 mg  4 mg Oral Q8H PRN Ward Givens, MD      . [DISCONTINUED] zolpidem (AMBIEN) tablet 10 mg  10 mg Oral QHS PRN Ward Givens, MD   10 mg at 03/22/12 2102    Observation Level/Precautions:  Detox  Laboratory:  Per emergency department  Psychotherapy:  Group   Medications:  Librium detox, continue Prozac  Consultations:  None   Discharge Concerns:  Risk for relapse   Estimated LOS: 5-7 days   Other:     I certify that inpatient services furnished can reasonably be expected to improve the patient's condition.   Atreus Hasz 12/7/201311:37 AM

## 2012-03-23 NOTE — Progress Notes (Signed)
Patient ID: Joshua Dillon, male   DOB: Jul 22, 1985, 26 y.o.   MRN: [id number] 26 yo male admitted for detox from ETOH, went to [facility] for seizure this past week, bruised L side of face/eye and L shoulder has scabbed over area, healing pink in color after drinking. He drink 40 oz beers 3-4 x/week, requesting help w/detox, wants long term program, SI in the past but not this time and was drunk when he made the SI statements. Denies Si or HI or AVH. Tattoos all over body shoulders, arms, legs. Pleasant and cooperative, states he is not depressed bu he is anxious, wants to rest at this time, PMH depression, ADHD, anxiety, Alcoholism and seizures secondary to Tramadol, UDS positive for benzo and THC. Admitted to unit and oriented, given something to eat and drink, placed in room given now meds and is presently resting in his bed.

## 2012-03-23 NOTE — [facility] Suicide Risk Assessment (Signed)
Suicide Risk Assessment  Admission Assessment     Nursing information obtained from:  Shift nurse Demographic factors:   white young male Current Mental Status:   sleepy and confused, mood sad, affect ristricted, logical with thought blocking at times, denies any si, hi or avh now Loss Factors:    Historical Factors:    Risk Reduction Factors:     CLINICAL FACTORS:   Alcohol/Substance Abuse/Dependencies  COGNITIVE FEATURES THAT CONTRIBUTE TO RISK:  Closed-mindedness Loss of executive function    SUICIDE RISK:   Mild:  Suicidal ideation of limited frequency, intensity, duration, and specificity.  There are no identifiable plans, no associated intent, mild dysphoria and related symptoms, good self-control (both objective and subjective assessment), few other risk factors, and identifiable protective factors, including available and accessible social support.  PLAN OF CARE: Axis I: alcohol dependence, cannabis dep,   Axis II: Deferred  Axis III:  Past Medical History   Diagnosis  Date   .  Depression    .  ADHD (attention deficit hyperactivity disorder)    .  Anxiety    .  Alcoholism    .  Seizure  03/2012     Secondary to tramadol.    Axis IV: problems with primary support group  Axis V: 41-50 serious symptoms  Plan  restart prozac Will not start klonopin Continue detox  Wonda Cerise 03/23/2012, 10:39 AM

## 2012-03-23 NOTE — Progress Notes (Signed)
D.  Pt pleasant and bright on approach, states having some withdrawal symptoms primarily anxiety and body aches.  Did attend evening group.  BP high this evening, Pt states not feeling symptomatic of this.  Denies SI/HI/hallucinations at present.  Wishes to stop drinking states "I am a danger when I drink" and showed his wounds from falling.  Interacting appropriately within unit.  A.  Medication given as ordered for withdrawal symptoms.  Support and encouragement offered  R.  No acute distress, will continue to monitor.

## 2012-03-23 NOTE — H&P (Signed)
  Pt was seen by me today. Will continue current meds.  The detailed H&P note will be done by mid level (NP/PA).  

## 2012-03-24 MED ORDER — QUETIAPINE FUMARATE 50 MG PO TABS
50.0000 mg | ORAL_TABLET | Freq: Every day | ORAL | Status: DC
  Administered 2012-03-24: 50 mg via ORAL
  Filled 2012-03-24 (×2): qty 1

## 2012-03-24 MED ORDER — QUETIAPINE FUMARATE 25 MG PO TABS
25.0000 mg | ORAL_TABLET | Freq: Three times a day (TID) | ORAL | Status: DC | PRN
  Administered 2012-03-24 – 2012-03-25 (×3): 25 mg via ORAL
  Filled 2012-03-24 (×3): qty 1

## 2012-03-24 NOTE — Progress Notes (Signed)
Patient did attend the evening speaker AA meeting.  

## 2012-03-24 NOTE — Progress Notes (Signed)
D slept fair last nite, first nite here, appetite is good, energy level is low and ability to pay attention is imrpoving, depressed 7/10 and hopeless 5/10, WD s/s include tremors, diarrhea, cravings, sedation and chills, denies Si or HI, attending group and participating, taking meds as ordered by MD and going to DR for meals, A q53min safety checks continue and support offered, encouraged to continue group and participating R patient remains safe on the unit

## 2012-03-24 NOTE — Progress Notes (Signed)
D.  Pt bright and pleasant on approach.  Still some anxiety from withdrawal.  Positive for evening group, interacting appropriately within milieu.  Denies SI/HI/hallucinations at this time.  A.  Support and encouragement offered R.  Will continue to monitor, no acute distress noted.

## 2012-03-24 NOTE — Progress Notes (Signed)
The focus of this group is to help patients establish daily goals to achieve during treatment and discuss how the patient can incorporate goal setting into their daily lives to aide in recovery. 03/24/12 0830 Patient attended and participated

## 2012-03-24 NOTE — Progress Notes (Signed)
[facility] MD Progress Note  03/24/2012 1:12 PM Joshua Dillon  MRN:  [id number] Subjective:  Joshua Dillon is complaining of feeling somewhat agitated today. He states in the past he has had Seroquel which was helpful. He also reports that it took him at least 2 hours to fall asleep last night, and do to his busy mind. He denies any cravings for alcohol, but states that he would really like to smoke some weed. He denies any withdrawal symptoms. He denies any suicidal or homicidal ideation. He denies any auditory or visual hallucinations. He does endorse some mild paranoia. His appetite is good.  Diagnosis:   Axis I: Generalized Anxiety Disorder; alcohol use disorder, severe Axis II: Deferred Axis III:  Past Medical History  Diagnosis Date  . Depression   . ADHD (attention deficit hyperactivity disorder)   . Anxiety   . Alcoholism   . Seizure 03/2012    Secondary to tramadol.    ADL's:  Intact  Sleep: Fair  Appetite:  Good  Suicidal Ideation:  Patient denies any thought, plan, or intent Homicidal Ideation:  Patient denies any thought, plan, or intent AEB (as evidenced by):  Psychiatric Specialty Exam: Review of Systems  Constitutional: Negative.   HENT: Negative.   Eyes: Positive for blurred vision.  Respiratory: Negative.   Cardiovascular: Negative.   Gastrointestinal: Negative.   Genitourinary: Negative.   Musculoskeletal: Negative.   Skin: Negative.   Neurological: Negative.   Endo/Heme/Allergies: Negative.   Psychiatric/Behavioral: Positive for depression and substance abuse. Negative for suicidal ideas and hallucinations. The patient is nervous/anxious and has insomnia.     Blood pressure 152/78, pulse 84, temperature 97.6 F (36.4 C), temperature source Oral, resp. rate 16, height 5\' 11"  (1.803 m), weight 88.905 kg (196 lb).Body mass index is 27.34 kg/(m^2).  General Appearance: Casual and Fairly Groomed  Patent attorney::  Good  Speech:  Clear and Coherent  Volume:  Normal   Mood:  Anxious  Affect:  Congruent  Thought Process:  Linear  Orientation:  Full (Time, Place, and Person)  Thought Content:  Paranoid Ideation  Suicidal Thoughts:  No  Homicidal Thoughts:  No  Memory:  Immediate;   Good Recent;   Good Remote;   Good  Judgement:  Fair  Insight:  Fair  Psychomotor Activity:  Increased and Restlessness  Concentration:  Good  Recall:  Good  Akathisia:  No  Handed:    AIMS (if indicated):     Assets:  Communication Skills Desire for Improvement Physical Health  Sleep:  Number of Hours: 5.5    Current Medications: Current Facility-Administered Medications  Medication Dose Route Frequency Provider Last Rate Last Dose  . acetaminophen (TYLENOL) tablet 650 mg  650 mg Oral Q6H PRN Sanjuana Kava, NP      . alum & mag hydroxide-simeth (MAALOX/MYLANTA) 200-200-20 MG/5ML suspension 30 mL  30 mL Oral Q4H PRN Sanjuana Kava, NP      . chlordiazePOXIDE (LIBRIUM) capsule 25 mg  25 mg Oral Q6H PRN Sanjuana Kava, NP   25 mg at 03/23/12 2213  . chlordiazePOXIDE (LIBRIUM) capsule 25 mg  25 mg Oral QID Sanjuana Kava, NP   25 mg at 03/24/12 1159   Followed by  . chlordiazePOXIDE (LIBRIUM) capsule 25 mg  25 mg Oral TID Sanjuana Kava, NP       Followed by  . chlordiazePOXIDE (LIBRIUM) capsule 25 mg  25 mg Oral BH-qamhs Sanjuana Kava, NP       Followed by  .  chlordiazePOXIDE (LIBRIUM) capsule 25 mg  25 mg Oral Daily Sanjuana Kava, NP      . FLUoxetine (PROZAC) capsule 40 mg  40 mg Oral BID Sanjuana Kava, NP   40 mg at 03/24/12 0754  . hydrOXYzine (ATARAX/VISTARIL) tablet 25 mg  25 mg Oral Q6H PRN Sanjuana Kava, NP      . loperamide (IMODIUM) capsule 2-4 mg  2-4 mg Oral PRN Sanjuana Kava, NP      . magnesium hydroxide (MILK OF MAGNESIA) suspension 30 mL  30 mL Oral Daily PRN Sanjuana Kava, NP      . multivitamin with minerals tablet 1 tablet  1 tablet Oral Daily Sanjuana Kava, NP   1 tablet at 03/24/12 0753  . nicotine (NICODERM CQ - dosed in mg/24 hours) patch 21 mg   21 mg Transdermal Q0600 Sanjuana Kava, NP   21 mg at 03/24/12 0608  . ondansetron (ZOFRAN-ODT) disintegrating tablet 4 mg  4 mg Oral Q6H PRN Sanjuana Kava, NP      . pantoprazole (PROTONIX) EC tablet 20 mg  20 mg Oral Daily Jorje Guild, PA-C   20 mg at 03/24/12 0754  . QUEtiapine (SEROQUEL) tablet 25 mg  25 mg Oral TID PRN Jorje Guild, PA-C      . QUEtiapine (SEROQUEL) tablet 50 mg  50 mg Oral QHS Jorje Guild, PA-C      . thiamine (B-1) injection 100 mg  100 mg Intramuscular Once Sanjuana Kava, NP      . thiamine (VITAMIN B-1) tablet 100 mg  100 mg Oral Daily Sanjuana Kava, NP   100 mg at 03/24/12 0758  . traZODone (DESYREL) tablet 50 mg  50 mg Oral QHS PRN,MR X 1 Jorje Guild, PA-C   50 mg at 03/23/12 2213  . [DISCONTINUED] FLUoxetine (PROZAC) capsule 40 mg  40 mg Oral BID Sanjuana Kava, NP   40 mg at 03/23/12 1041    Lab Results:  Results for orders placed during the hospital encounter of 03/22/12 (from the past 48 hour(s))  ETHANOL     Status: Normal   Collection Time   03/22/12  2:52 PM      Component Value Range Comment   Alcohol, Ethyl (B) <11  0 - 11 mg/dL   CBC WITH DIFFERENTIAL     Status: Abnormal   Collection Time   03/22/12  2:52 PM      Component Value Range Comment   WBC 8.7  4.0 - 10.5 K/uL    RBC 4.35  4.22 - 5.81 MIL/uL    Hemoglobin 14.4  13.0 - 17.0 g/dL    HCT 40.9  81.1 - 91.4 %    MCV 94.0  78.0 - 100.0 fL    MCH 33.1  26.0 - 34.0 pg    MCHC 35.2  30.0 - 36.0 g/dL    RDW 78.2  95.6 - 21.3 %    Platelets 224  150 - 400 K/uL    Neutrophils Relative 72  43 - 77 %    Neutro Abs 6.3  1.7 - 7.7 K/uL    Lymphocytes Relative 12  12 - 46 %    Lymphs Abs 1.1  0.7 - 4.0 K/uL    Monocytes Relative 15 (*) 3 - 12 %    Monocytes Absolute 1.3 (*) 0.1 - 1.0 K/uL    Eosinophils Relative 0  0 - 5 %    Eosinophils Absolute 0.0  0.0 -  0.7 K/uL    Basophils Relative 0  0 - 1 %    Basophils Absolute 0.0  0.0 - 0.1 K/uL   COMPREHENSIVE METABOLIC PANEL     Status: Abnormal   Collection  Time   03/22/12  2:52 PM      Component Value Range Comment   Sodium 138  135 - 145 mEq/L    Potassium 3.8  3.5 - 5.1 mEq/L    Chloride 100  96 - 112 mEq/L    CO2 28  19 - 32 mEq/L    Glucose, Bld 104 (*) 70 - 99 mg/dL    BUN 8  6 - 23 mg/dL    Creatinine, Ser 1.61  0.50 - 1.35 mg/dL    Calcium 9.7  8.4 - 09.6 mg/dL    Total Protein 6.9  6.0 - 8.3 g/dL    Albumin 3.8  3.5 - 5.2 g/dL    AST 045 (*) 0 - 37 U/L    ALT 52  0 - 53 U/L    Alkaline Phosphatase 88  39 - 117 U/L    Total Bilirubin 0.3  0.3 - 1.2 mg/dL    GFR calc non Af Amer >90  >90 mL/min    GFR calc Af Amer >90  >90 mL/min   MAGNESIUM     Status: Normal   Collection Time   03/22/12  2:52 PM      Component Value Range Comment   Magnesium 1.8  1.5 - 2.5 mg/dL   URINALYSIS, ROUTINE W REFLEX MICROSCOPIC     Status: Normal   Collection Time   03/22/12  3:55 PM      Component Value Range Comment   Color, Urine YELLOW  YELLOW    APPearance CLEAR  CLEAR    Specific Gravity, Urine 1.025  1.005 - 1.030    pH 6.5  5.0 - 8.0    Glucose, UA NEGATIVE  NEGATIVE mg/dL    Hgb urine dipstick NEGATIVE  NEGATIVE    Bilirubin Urine NEGATIVE  NEGATIVE    Ketones, ur NEGATIVE  NEGATIVE mg/dL    Protein, ur NEGATIVE  NEGATIVE mg/dL    Urobilinogen, UA 0.2  0.0 - 1.0 mg/dL    Nitrite NEGATIVE  NEGATIVE    Leukocytes, UA NEGATIVE  NEGATIVE MICROSCOPIC NOT DONE ON URINES WITH NEGATIVE PROTEIN, BLOOD, LEUKOCYTES, NITRITE, OR GLUCOSE <1000 mg/dL.  URINE RAPID DRUG SCREEN (HOSP PERFORMED)     Status: Abnormal   Collection Time   03/22/12  3:55 PM      Component Value Range Comment   Opiates NONE DETECTED  NONE DETECTED    Cocaine NONE DETECTED  NONE DETECTED    Benzodiazepines POSITIVE (*) NONE DETECTED    Amphetamines NONE DETECTED  NONE DETECTED    Tetrahydrocannabinol POSITIVE (*) NONE DETECTED    Barbiturates NONE DETECTED  NONE DETECTED     Physical Findings: AIMS: Facial and Oral Movements Muscles of Facial Expression: None,  normal Lips and Perioral Area: None, normal Jaw: None, normal Tongue: None, normal,Extremity Movements Upper (arms, wrists, hands, fingers): None, normal Lower (legs, knees, ankles, toes): None, normal, Trunk Movements Neck, shoulders, hips: None, normal, Overall Severity Severity of abnormal movements (highest score from questions above): None, normal Incapacitation due to abnormal movements: None, normal Patient's awareness of abnormal movements (rate only patient's report): No Awareness, Dental Status Current problems with teeth and/or dentures?: Yes Does patient usually wear dentures?: No  CIWA:  CIWA-Ar Total: 6  COWS:  Treatment Plan Summary: Daily contact with patient to assess and evaluate symptoms and progress in treatment Medication management We'll start him on Seroquel 25 mg 3 times daily as needed for irritability or agitation, and 50 mg at bedtime to target sleep. We discussed the possibility of Neurontin for anxiety.  Medical Decision Making Problem Points:  Established problem, stable/improving (1), New problem, with no additional work-up planned (3) and Review of last therapy session (1) Data Points:  Review and summation of old records (2) Review of medication regiment & side effects (2) Review of new medications or change in dosage (2)  I certify that inpatient services furnished can reasonably be expected to improve the patient's condition.   Shakera Ebrahimi 03/24/2012, 1:12 PM

## 2012-03-24 NOTE — Clinical Social Work Note (Signed)
[facility] Group Notes:  (Clinical Social Work)  03/24/2012  10:00-11:00AM  Summary of Progress/Problems:   The main focus of today's process group was for the patient to define "support" and describe what healthy supports are, then to identify the patient's current support system and decide on other supports that can be put in place to prevent future hospitalizations.   An emphasis was placed on using therapist, doctor and problem-specific support groups to expand supports.  The patient expressed that he has a mother, father, stepfather, friend/pastor, grandparents who all support him.  He had a seizure last week from his alcohol use and feels he has "hit rock bottom."  He talked about how he has alienated all his friends through his alcohol use, and how he initially started to use to deal with his anxiety problems.  He related that he has been on Prozac which has helped with his depression/anxiety "to a certain extent" but that he went off the medication because he stopped going to [facility].  He also is supposed to be on 1,000 mg of Depakote which is $110 monthly except when he goes to [facility], and they help him to get it for free.  He thinks he needs to go from [facility] for rehab, but wants to be home for Christmas first.  CSW and group confronted him about this, and he agreed that this would be self-sabotage.  He lives across the street from the convenience store where he purchases his alcohol, and is planning to move.  Type of Therapy:  Process Group  Participation Level:  Active  Participation Quality:  Appropriate, Attentive, Sharing and Supportive  Affect:  Blunted and Depressed  Cognitive:  Appropriate and Oriented  Insight:  Engaged  Engagement in Therapy:  Engaged  Modes of Intervention:  Clarification, Education, Limit-setting, Problem-solving, Socialization, Support and Processing, Exploration, Discussion   Joshua Mantle, LCSW 03/24/2012,

## 2012-03-25 DIAGNOSIS — F1020 Alcohol dependence, uncomplicated: Secondary | ICD-10-CM | POA: Diagnosis present

## 2012-03-25 DIAGNOSIS — F329 Major depressive disorder, single episode, unspecified: Secondary | ICD-10-CM

## 2012-03-25 MED ORDER — QUETIAPINE FUMARATE 50 MG PO TABS
50.0000 mg | ORAL_TABLET | Freq: Three times a day (TID) | ORAL | Status: DC | PRN
  Administered 2012-03-25 – 2012-03-27 (×4): 50 mg via ORAL
  Filled 2012-03-25 (×2): qty 1

## 2012-03-25 MED ORDER — QUETIAPINE FUMARATE 100 MG PO TABS
100.0000 mg | ORAL_TABLET | Freq: Every day | ORAL | Status: DC
  Administered 2012-03-25 – 2012-03-26 (×2): 100 mg via ORAL
  Filled 2012-03-25 (×4): qty 1

## 2012-03-25 NOTE — Tx Team (Signed)
Interdisciplinary Treatment Plan Update (Adult)  Date:  03/25/2012  Time Reviewed:  10:48 AM   Progress in Treatment: Attending groups: Yes Participating in groups:  Yes Taking medication as prescribed: Yes Tolerating medication:  Yes Family/Significant othe contact made:  CSW assessing for appropriate contact Patient understands diagnosis:  Yes Discussing patient identified problems/goals with staff:  Yes Medical problems stabilized or resolved:  Yes Denies suicidal/homicidal ideation: Yes Issues/concerns per patient self-inventory:  None identified Other: N/A  New problem(s) identified: None Identified  Reason for Continuation of Hospitalization: Withdrawal symptoms Medication stabilization  Interventions implemented related to continuation of hospitalization: mood stabilization, medication monitoring and adjustment, group therapy and psycho education, suicide risk assessment, collateral contact, aftercare planning, ongoing physician assessments and safety checks q 15 mins  Additional comments: N/A  Estimated length of stay: 3-5 days  Discharge Plan: CSW is assessing for appropriate referrals.  Pt wants to go to long term treatment but after the holidays.    New goal(s): N/A  Review of initial/current patient goals per problem list:    1.  Goal(s): Address substance use by completing detox protocol  Met:  No  Target date: 4 days  As evidenced by: Completed detox protocol on 12/12   Attendees: Patient:     Family:     Physician: Geoffery Lyons, MD 03/25/2012 10:48 AM   Nursing: Alease Frame, RN  03/25/2012 10:48 AM   Clinical Social Worker:  [name], LCSWA 03/25/2012  10:48 AM   Other: Robbie Louis, RN 03/25/2012  10:48 AM   Other:  Nanine Means, NP 03/25/2012 10:51 AM   Other:     Other:     Other:      Scribe for Treatment Team:   [name] 03/25/2012 10:48 AM
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
## 2012-03-25 NOTE — Progress Notes (Signed)
[facility] MD Progress Note  03/25/2012 4:30 PM Joshua Dillon  MRN:  [id number] Subjective:  Admits that he is dealing with a lot of shame for his behavior. A lot of regrets as he has messed up relationships, make people he loves suffer. He sees himself as "a mean drunk." The wake up call was when he suffered a seizure while at an [employer]. He got also upset when he was exhibiting his usual drunk behavior and he felt he scared a friend of his little brother. He had been diagnosed with Bipolar and given Depakote. He is not sure if it did anything. He had bee off Depakote for at lest 7-8 weeks. He does admit to mood fluctuation, some associated to his drinking.Admits to a lot of anxiety as well as insomnia Diagnosis:   Axis I: Alcohol Dependence, Depressive Disorder NOS, Anxiety Disorder NOS, R/ O Substance induced mood disorder Axis II: Deferred Axis III:  Past Medical History  Diagnosis Date  . Depression   . ADHD (attention deficit hyperactivity disorder)   . Anxiety   . Alcoholism   . Seizure 03/2012    Secondary to tramadol.   Axis IV: economic problems and occupational problems Axis V: 51-60 moderate symptoms  ADL's:  Intact  Sleep: Fair  Appetite:  Fair  Suicidal Ideation:  Plan:  Denies Intent:  Denies Means:  Denies Homicidal Ideation:  Plan:  Denies Intent:  Denies Means:  Denies AEB (as evidenced by):  Psychiatric Specialty Exam: Review of Systems  Constitutional: Negative.   HENT: Negative.   Eyes: Negative.   Respiratory: Negative.   Cardiovascular: Negative.   Gastrointestinal: Negative.   Genitourinary: Negative.   Musculoskeletal: Negative.   Skin: Negative.   Neurological: Negative.   Endo/Heme/Allergies: Negative.   Psychiatric/Behavioral: Positive for substance abuse. The patient is nervous/anxious and has insomnia.     Blood pressure 128/78, pulse 79, temperature 97.6 F (36.4 C), temperature source Oral, resp. rate 18, height 5\' 11"  (1.803 m),  weight 88.905 kg (196 lb).Body mass index is 27.34 kg/(m^2).  General Appearance: Fairly Groomed  Patent attorney::  Fair  Speech:  Clear and Coherent  Volume:  Decreased  Mood:  Anxious, Dysphoric, Hopeless, Worthless and worried  Affect:  Restricted  Thought Process:  Coherent and Goal Directed  Orientation:  Full (Time, Place, and Person)  Thought Content:  Rumination and worries, poor self esteem, a sense of worthlessness, shame and guilt  Suicidal Thoughts:  No  Homicidal Thoughts:  No  Memory:  Immediate;   Fair Recent;   Fair Remote;   Fair  Judgement:  Intact  Insight:  Present  Psychomotor Activity:  Decreased  Concentration:  Fair  Recall:  Fair  Akathisia:  No  Handed:  Right  AIMS (if indicated):     Assets:  Desire for Improvement  Sleep:  Number of Hours: 6    Current Medications: Current Facility-Administered Medications  Medication Dose Route Frequency Provider Last Rate Last Dose  . acetaminophen (TYLENOL) tablet 650 mg  650 mg Oral Q6H PRN Sanjuana Kava, NP      . alum & mag hydroxide-simeth (MAALOX/MYLANTA) 200-200-20 MG/5ML suspension 30 mL  30 mL Oral Q4H PRN Sanjuana Kava, NP      . chlordiazePOXIDE (LIBRIUM) capsule 25 mg  25 mg Oral Q6H PRN Sanjuana Kava, NP   25 mg at 03/25/12 1502  . [COMPLETED] chlordiazePOXIDE (LIBRIUM) capsule 25 mg  25 mg Oral QID Sanjuana Kava, NP   25  mg at 03/24/12 2120   Followed by  . chlordiazePOXIDE (LIBRIUM) capsule 25 mg  25 mg Oral TID Sanjuana Kava, NP   25 mg at 03/25/12 1148   Followed by  . chlordiazePOXIDE (LIBRIUM) capsule 25 mg  25 mg Oral BH-qamhs Sanjuana Kava, NP       Followed by  . chlordiazePOXIDE (LIBRIUM) capsule 25 mg  25 mg Oral Daily Sanjuana Kava, NP      . FLUoxetine (PROZAC) capsule 40 mg  40 mg Oral BID Sanjuana Kava, NP   40 mg at 03/25/12 0754  . hydrOXYzine (ATARAX/VISTARIL) tablet 25 mg  25 mg Oral Q6H PRN Sanjuana Kava, NP   25 mg at 03/24/12 2150  . loperamide (IMODIUM) capsule 2-4 mg  2-4 mg  Oral PRN Sanjuana Kava, NP      . magnesium hydroxide (MILK OF MAGNESIA) suspension 30 mL  30 mL Oral Daily PRN Sanjuana Kava, NP      . multivitamin with minerals tablet 1 tablet  1 tablet Oral Daily Sanjuana Kava, NP   1 tablet at 03/25/12 0754  . nicotine (NICODERM CQ - dosed in mg/24 hours) patch 21 mg  21 mg Transdermal Q0600 Sanjuana Kava, NP   21 mg at 03/25/12 0607  . ondansetron (ZOFRAN-ODT) disintegrating tablet 4 mg  4 mg Oral Q6H PRN Sanjuana Kava, NP      . pantoprazole (PROTONIX) EC tablet 20 mg  20 mg Oral Daily Jorje Guild, PA-C   20 mg at 03/25/12 0754  . QUEtiapine (SEROQUEL) tablet 100 mg  100 mg Oral QHS Rachael Fee, MD      . QUEtiapine (SEROQUEL) tablet 50 mg  50 mg Oral TID PRN Rachael Fee, MD   50 mg at 03/25/12 1419  . thiamine (B-1) injection 100 mg  100 mg Intramuscular Once Sanjuana Kava, NP      . thiamine (VITAMIN B-1) tablet 100 mg  100 mg Oral Daily Sanjuana Kava, NP   100 mg at 03/25/12 0755  . traZODone (DESYREL) tablet 50 mg  50 mg Oral QHS PRN,MR X 1 Jorje Guild, PA-C   50 mg at 03/24/12 2150  . [DISCONTINUED] QUEtiapine (SEROQUEL) tablet 25 mg  25 mg Oral TID PRN Jorje Guild, PA-C   25 mg at 03/25/12 0754  . [DISCONTINUED] QUEtiapine (SEROQUEL) tablet 50 mg  50 mg Oral QHS Jorje Guild, PA-C   50 mg at 03/24/12 2121    Lab Results: No results found for this or any previous visit (from the past 48 hour(s)).  Physical Findings: AIMS: Facial and Oral Movements Muscles of Facial Expression: None, normal Lips and Perioral Area: None, normal Jaw: None, normal Tongue: None, normal,Extremity Movements Upper (arms, wrists, hands, fingers): None, normal Lower (legs, knees, ankles, toes): None, normal, Trunk Movements Neck, shoulders, hips: None, normal, Overall Severity Severity of abnormal movements (highest score from questions above): None, normal Incapacitation due to abnormal movements: None, normal Patient's awareness of abnormal movements (rate only patient's  report): No Awareness, Dental Status Current problems with teeth and/or dentures?: Yes Does patient usually wear dentures?: No  CIWA:  CIWA-Ar Total: 3  COWS:     Treatment Plan Summary: Daily contact with patient to assess and evaluate symptoms and progress in treatment Medication management  Plan: Will increase the Seroquel to 50 mg TID PRN, 100 mg HS  Medical Decision Making Problem Points:  Established problem, stable/improving (1) and Review  of psycho-social stressors (1) Data Points:  Review of medication regiment & side effects (2) Review of new medications or change in dosage (2)  I certify that inpatient services furnished can reasonably be expected to improve the patient's condition.   Avian Konigsberg A 03/25/2012, 4:30 PM

## 2012-03-25 NOTE — Clinical Social Work Note (Signed)
[facility] LCSW Group Therapy  03/25/2012  1:15 PM   Type of Therapy:  Group Therapy  Participation Level:  Active  Participation Quality:  Appropriate and Attentive  Affect:  Appropriate  Cognitive:  Alert and Appropriate  Insight:  Developing/Improving  Engagement in Therapy:  Developing/Improving  Modes of Intervention:  Clarification, Discussion, Exploration, Rapport Building, Socialization and Support  Summary of Progress/Problems: The topic for group today was overcoming obstacles.  Pt discussed overcoming obstacles and what this means for pt.  Pt discussed finding out his liver has been affected by his drinking and that this news scared him.  Pt states that an external obstacle he faces is living across the street from a convenient store and living in a bad environment.  Pt discussed a plan to overcome this obstacle as well as his alcohol use by living with his family and going for further treatment.      [name], LCSWA 03/25/2012 3:04 PM

## 2012-03-25 NOTE — Progress Notes (Signed)
D slept fair last nite, appetite is good, energy level is normal, ability to pay attention is improving, depressed 4/10 and hopeless 2/10, denies SI or HI, c/o WD s/s of diarrhea, cravings and agitation, L shoulder is looking clean but scraped, taking meds as ordered by MD, and going to DR for meals, attending group and participating, pleasant and cooperative to staff/peers, wants long term placement after the Holidays, A q21min safety checks continue and support offered, encouraged to continue attending group and interacting w/peers R patient remains safe on the unit

## 2012-03-25 NOTE — Progress Notes (Signed)
Psychoeducational Group Note  Date:  03/25/2012 Time: 2000  Group Topic/Focus:  AA--Addiction  Participation Level:  Minimal  Participation Quality:  Appropriate  Affect:  Appropriate  Cognitive:  Alert  Insight:    Engagement in Group:  Limited  Additional Comments:    Humberto Seals Monique 03/25/2012, 10:28 PM

## 2012-03-25 NOTE — Clinical Social Work Note (Signed)
[facility] LCSW Aftercare Discharge Planning Group Note  03/25/2012 8:45 AM  Participation Quality:  Did not attend   [name], LCSWA 03/25/2012 9:48 AM

## 2012-03-25 NOTE — Progress Notes (Signed)
Psychoeducational Group Note  Date:  03/25/2012 Time:  1100  Group Topic/Focus:  Self Care  Participation Level:  Active  Participation Quality:  Appropriate and Attentive  Affect:  Appropriate  Cognitive:  Appropriate  Insight:  Engaged  Engagement in Group:  Engaged  Additional Comments:  Staff explained to the patient that this group is designed to assist in identifying activities that they will incorporate into their daily living with the intention of improving or restoring emotional, physical, spiritual, interpersonal and financial health. The patients were asked to identify one area or skill where they are taking care of themselves. Patients will identify 2-3 activities of self- care that they will use in their daily living after discharge. Patients were encouraged to utilize the skills and techniques taught and apply it in their daily routine.   Ardelle Park O 03/25/2012, 2:54 PM

## 2012-03-26 MED ORDER — QUETIAPINE FUMARATE 100 MG PO TABS
100.0000 mg | ORAL_TABLET | Freq: Three times a day (TID) | ORAL | Status: DC | PRN
  Administered 2012-03-26 – 2012-03-27 (×3): 100 mg via ORAL
  Filled 2012-03-26: qty 1

## 2012-03-26 MED ORDER — NICOTINE POLACRILEX 2 MG MT GUM
2.0000 mg | CHEWING_GUM | OROMUCOSAL | Status: DC | PRN
  Administered 2012-03-26 – 2012-03-28 (×7): 2 mg via ORAL
  Filled 2012-03-26: qty 1

## 2012-03-26 MED ORDER — QUETIAPINE FUMARATE 50 MG PO TABS
ORAL_TABLET | ORAL | Status: AC
  Filled 2012-03-26: qty 2

## 2012-03-26 NOTE — Progress Notes (Signed)
[facility] MD Progress Note  03/26/2012 12:31 PM Joshua Dillon  MRN:  [id number] Subjective:  Still endorses anxiety, and a sense of being uncomfortable around people he calls (being paranoid.) He tries to relax, but he cant. He did sleep some better. The Seroquel he feels like is trying to help, but thinks that the dose is not strong enough. He has used the alcohol to cope, but does not want to do it anymore. Will like to see if the Seroquel helps with his mood so he does not have to use the Depakote. Did not particularly liked the Depakote Diagnosis:   Axis I: Alcohol Dependence, Anxiety Disorder NOS, Mood Disorder NOS Axis II: Deferred Axis III:  Past Medical History  Diagnosis Date  . Depression   . ADHD (attention deficit hyperactivity disorder)   . Anxiety   . Alcoholism   . Seizure 03/2012    Secondary to tramadol.   Axis IV: economic problems, housing problems, occupational problems and problems with primary support group Axis V: 51-60 moderate symptoms  ADL's:  Intact  Sleep: Fair  Appetite:  Fair  Suicidal Ideation:  Plan:  Denies Intent:  Denies Means:  Denies Homicidal Ideation:  Plan:  Denies Intent:  Denies Means:  Denies AEB (as evidenced by):  Psychiatric Specialty Exam: Review of Systems  Constitutional: Negative.   HENT: Negative.   Eyes: Negative.   Respiratory: Negative.   Cardiovascular: Negative.   Gastrointestinal: Negative.   Genitourinary: Negative.   Musculoskeletal: Negative.   Skin: Negative.   Neurological: Negative.   Endo/Heme/Allergies: Negative.   Psychiatric/Behavioral: Positive for depression and substance abuse. The patient is nervous/anxious and has insomnia.     Blood pressure 114/71, pulse 77, temperature 97.2 F (36.2 C), temperature source Oral, resp. rate 16, height 5\' 11"  (1.803 m), weight 88.905 kg (196 lb).Body mass index is 27.34 kg/(m^2).  General Appearance: Fairly Groomed  Patent attorney::  Fair  Speech:  Clear and  Coherent  Volume:  Normal  Mood:  Anxious, Depressed and worried  Affect:  Restricted  Thought Process:  Coherent and Goal Directed  Orientation:  Full (Time, Place, and Person)  Thought Content:  worries, where to go from here, other people intentions, motives  Suicidal Thoughts:  No  Homicidal Thoughts:  No  Memory:  Immediate;   Fair Recent;   Fair Remote;   Fair  Judgement:  Fair  Insight:  Shallow  Psychomotor Activity:  Decreased  Concentration:  Fair  Recall:  Fair  Akathisia:  No  Handed:  Right  AIMS (if indicated):     Assets:  Desire for Improvement  Sleep:  Number of Hours: 6.5    Current Medications: Current Facility-Administered Medications  Medication Dose Route Frequency Provider Last Rate Last Dose  . acetaminophen (TYLENOL) tablet 650 mg  650 mg Oral Q6H PRN Sanjuana Kava, NP   650 mg at 03/26/12 0818  . alum & mag hydroxide-simeth (MAALOX/MYLANTA) 200-200-20 MG/5ML suspension 30 mL  30 mL Oral Q4H PRN Sanjuana Kava, NP      . [EXPIRED] chlordiazePOXIDE (LIBRIUM) capsule 25 mg  25 mg Oral Q6H PRN Sanjuana Kava, NP   25 mg at 03/25/12 1502  . [COMPLETED] chlordiazePOXIDE (LIBRIUM) capsule 25 mg  25 mg Oral TID Sanjuana Kava, NP   25 mg at 03/25/12 1721   Followed by  . chlordiazePOXIDE (LIBRIUM) capsule 25 mg  25 mg Oral BH-qamhs Sanjuana Kava, NP   25 mg at 03/26/12 [phone]  Followed by  . chlordiazePOXIDE (LIBRIUM) capsule 25 mg  25 mg Oral Daily Sanjuana Kava, NP      . FLUoxetine (PROZAC) capsule 40 mg  40 mg Oral BID Sanjuana Kava, NP   40 mg at 03/26/12 0818  . [EXPIRED] hydrOXYzine (ATARAX/VISTARIL) tablet 25 mg  25 mg Oral Q6H PRN Sanjuana Kava, NP   25 mg at 03/24/12 2150  . [EXPIRED] loperamide (IMODIUM) capsule 2-4 mg  2-4 mg Oral PRN Sanjuana Kava, NP      . magnesium hydroxide (MILK OF MAGNESIA) suspension 30 mL  30 mL Oral Daily PRN Sanjuana Kava, NP      . multivitamin with minerals tablet 1 tablet  1 tablet Oral Daily Sanjuana Kava, NP   1 tablet  at 03/26/12 0818  . nicotine polacrilex (NICORETTE) gum 2 mg  2 mg Oral PRN Kerry Hough, PA      . [EXPIRED] ondansetron (ZOFRAN-ODT) disintegrating tablet 4 mg  4 mg Oral Q6H PRN Sanjuana Kava, NP      . pantoprazole (PROTONIX) EC tablet 20 mg  20 mg Oral Daily Jorje Guild, PA-C   20 mg at 03/26/12 0818  . QUEtiapine (SEROQUEL) 50 MG tablet           . QUEtiapine (SEROQUEL) tablet 100 mg  100 mg Oral QHS Rachael Fee, MD   100 mg at 03/25/12 2137  . QUEtiapine (SEROQUEL) tablet 100 mg  100 mg Oral TID PRN Rachael Fee, MD   100 mg at 03/26/12 1103  . thiamine (B-1) injection 100 mg  100 mg Intramuscular Once Sanjuana Kava, NP      . thiamine (VITAMIN B-1) tablet 100 mg  100 mg Oral Daily Sanjuana Kava, NP   100 mg at 03/26/12 0818  . traZODone (DESYREL) tablet 50 mg  50 mg Oral QHS PRN,MR X 1 Jorje Guild, PA-C   50 mg at 03/25/12 2139  . [DISCONTINUED] nicotine (NICODERM CQ - dosed in mg/24 hours) patch 21 mg  21 mg Transdermal Q0600 Sanjuana Kava, NP   21 mg at 03/25/12 0607  . [DISCONTINUED] QUEtiapine (SEROQUEL) tablet 25 mg  25 mg Oral TID PRN Jorje Guild, PA-C   25 mg at 03/25/12 0754  . [DISCONTINUED] QUEtiapine (SEROQUEL) tablet 50 mg  50 mg Oral QHS Jorje Guild, PA-C   50 mg at 03/24/12 2121  . [DISCONTINUED] QUEtiapine (SEROQUEL) tablet 50 mg  50 mg Oral TID PRN Rachael Fee, MD   50 mg at 03/26/12 0605    Lab Results: No results found for this or any previous visit (from the past 48 hour(s)).  Physical Findings: AIMS: Facial and Oral Movements Muscles of Facial Expression: None, normal Lips and Perioral Area: None, normal Jaw: None, normal Tongue: None, normal,Extremity Movements Upper (arms, wrists, hands, fingers): None, normal Lower (legs, knees, ankles, toes): None, normal, Trunk Movements Neck, shoulders, hips: None, normal, Overall Severity Severity of abnormal movements (highest score from questions above): None, normal Incapacitation due to abnormal movements: None,  normal Patient's awareness of abnormal movements (rate only patient's report): No Awareness, Dental Status Current problems with teeth and/or dentures?: Yes Does patient usually wear dentures?: No  CIWA:  CIWA-Ar Total: 3  COWS:     Treatment Plan Summary: Daily contact with patient to assess and evaluate symptoms and progress in treatment Medication management  Plan: Supportive approach/coping skills/relapse prevention/optimize treatment with the Seroquel Increase Seroquel to 100 mg  TID and HS Medical Decision Making Problem Points:  Established problem, worsening (2), Review of last therapy session (1) and Review of psycho-social stressors (1) Data Points:  Review of medication regiment & side effects (2) Review of new medications or change in dosage (2)  I certify that inpatient services furnished can reasonably be expected to improve the patient's condition.   Feliz Herard A 03/26/2012, 12:31 PM

## 2012-03-26 NOTE — Clinical Social Work Note (Signed)
[facility] LCSW Group Therapy  03/26/2012  1:15 PM   Type of Therapy:  Group Therapy  Participation Level:  Did Not Attend with speaker from mental health association  Shatha Hooser Horton, LCSWA 03/26/2012 2:16 PM   

## 2012-03-26 NOTE — Progress Notes (Signed)
Pt observed in the game room playing a game with two other patients.  In 1:1 conversation, pt reports he has had a good day.  He feels the Seroquel is helping his anxiety.  The MD increased the dose today per pt's request as he felt the prior dose was not enough.  He denies withdrawal symptoms at this time.  He denies SI/HI/AV.  RN from earlier shift reported that pt had not attended any groups today, but was in the bed most of the time.  Pt is considering [facility] for long term treatment.  He voices no needs/concerns at this time.  Safety maintained with q15 minute checks.

## 2012-03-26 NOTE — Progress Notes (Signed)
Patient ID: Joshua Dillon, male   DOB: 1985/09/15, 26 y.o.   MRN: [id number] He has been up and to part of the  groups interacting with peers and staff.  Self inventory: depression 0, hopelessness 3 W/D diarrhea sedation, denies SI thoughts .  He has been in bed off and on today. Has requested and received prn for anxiety twice today. He has been interacting with peers in a calm manner.

## 2012-03-26 NOTE — Progress Notes (Signed)
Pt reports he has been feeling anxious this evening.  He says he had some Seroquel earlier, and feels it helped, but needs another dose.  He does not c/o any other withdrawal symptoms at this time.  He denies SI/HI/AV.  He wants to go to long term treatment, but after the holidays.  He does not want to return to where he was living because he says the area is dangerous.  His plan is to stay with his mother if he cannot go directly to treatment.  He is considering going to [facility].  He says he has been going to groups.  Support/encouragement given.  Pt makes his needs known to staff.  Safety maintained with q15 minute checks.

## 2012-03-26 NOTE — Progress Notes (Signed)
Psychoeducational Group Note  Date:  03/26/2012 Time:  1100  Group Topic/Focus:  Recovery Goals:   The focus of this group is to identify appropriate goals for recovery and establish a plan to achieve them.  Participation Level:  Active  Participation Quality:  Appropriate and Attentive  Affect:  Appropriate  Cognitive:  Appropriate  Insight:  Engaged  Engagement in Group:  Engaged  Additional Comments:  Staff explained to the patient that the purpose of this group is to educate them on recovery, and on setting mid-range to long-term personal goals for their recovery process.  Staff also explained that the group will also address topics including what recovery is, who goes through recovery, the first steps toward recovery, and setting realistic goals for recovery. Patient was asked to identify two areas of their life in which they would like to make a change toward recovery, and set a specific measurable goal to address each change identified. Patient was provided with a homework assignment regarding how this goal impacts their personal recovery. Staff concluded the group by encouraging the patient to take a proactive approach to accomplishing their recovery goals.    Ardelle Park O 03/26/2012, 6:19 PM

## 2012-03-26 NOTE — Clinical Social Work Note (Signed)
[facility] LCSW Aftercare Discharge Planning Group Note  03/26/2012 8:45 AM  Participation Quality:  Did Not Attend   [name], LCSWA 03/26/2012 9:57 AM

## 2012-03-26 NOTE — Progress Notes (Signed)
Psychoeducational Group Note  Date:  03/26/2012 Time:  2000  Group Topic/Focus:  AA meeting   Participation Level:  Active  Participation Quality:  Appropriate  Affect:  Appropriate  Cognitive:  Appropriate  Insight:  Engaged  Engagement in Group:  Engaged  Additional Comments:  Pt attended AA meeting this evening.   Ashwini Jago A 03/26/2012, 9:45 PM

## 2012-03-26 NOTE — Progress Notes (Signed)
[facility] Group Notes:  (Counselor/Nursing/MHT/Case Management/Adjunct)  03/26/2012 1:07 PM  Type of Therapy:  Psychoeducational Skills  Participation Level:  Did Not Attend  Summary of Progress/Problems: Joshua Dillon did not attend a psychoeducational group that focused on using quality time with support systems/indivudals to engage in healthy coping skills. Wandra Scot 03/26/2012, 1:07 PM

## 2012-03-27 DIAGNOSIS — F1020 Alcohol dependence, uncomplicated: Principal | ICD-10-CM

## 2012-03-27 DIAGNOSIS — F39 Unspecified mood [affective] disorder: Secondary | ICD-10-CM

## 2012-03-27 MED ORDER — QUETIAPINE FUMARATE 50 MG PO TABS
150.0000 mg | ORAL_TABLET | Freq: Three times a day (TID) | ORAL | Status: DC
  Administered 2012-03-27 – 2012-03-28 (×4): 150 mg via ORAL
  Filled 2012-03-27 (×9): qty 3

## 2012-03-27 MED ORDER — QUETIAPINE FUMARATE 50 MG PO TABS
150.0000 mg | ORAL_TABLET | Freq: Every day | ORAL | Status: DC
  Administered 2012-03-27: 150 mg via ORAL
  Filled 2012-03-27 (×3): qty 3

## 2012-03-27 MED ORDER — QUETIAPINE FUMARATE 50 MG PO TABS
ORAL_TABLET | ORAL | Status: AC
  Administered 2012-03-27: 50 mg via ORAL
  Filled 2012-03-27: qty 1

## 2012-03-27 MED ORDER — QUETIAPINE FUMARATE 50 MG PO TABS
50.0000 mg | ORAL_TABLET | Freq: Once | ORAL | Status: DC
  Filled 2012-03-27: qty 1

## 2012-03-27 NOTE — Progress Notes (Signed)
Psychoeducational Group Note  Date:  03/27/2012 Time:  2000  Group Topic/Focus:  NA group  Participation Level:  Active  Participation Quality:  Appropriate  Affect:  Appropriate  Cognitive:  Appropriate  Insight:  Engaged  Engagement in Group:  Engaged  Additional CommentsFlonnie Hailstone 03/27/2012, 9:36 PM

## 2012-03-27 NOTE — Progress Notes (Signed)
Patient ID: Joshua Dillon, male   DOB: 1985/09/30, 26 y.o.   MRN: [id number] He has been up and to groups interacting with peers and staff He continues to say that he has anxiety and his Seroquel has been increased. He had a argument with his roommate and he moved to another room they did not get along. Self inventory: Depression 1, hopelessness 8 (up from a  0 yesterday). He is having withdrawal symptoms today of  Sedation, craving, and agitation.

## 2012-03-27 NOTE — Clinical Social Work Note (Signed)
Interdisciplinary Treatment Plan Update (Adult)  Date:  03/27/2012  Time Reviewed:  9:45 AM   Progress in Treatment: Attending groups: Yes Participating in groups:  Yes Taking medication as prescribed: Yes Tolerating medication:  Yes Family/Significant othe contact made: N/A Patient understands diagnosis:  Yes Discussing patient identified problems/goals with staff:  Yes Medical problems stabilized or resolved:  Yes Denies suicidal/homicidal ideation: Yes Issues/concerns per patient self-inventory:  None identified Other: N/A  New problem(s) identified: None Identified  Reason for Continuation of Hospitalization: Anxiety Depression Medication stabilization Withdrawal symptoms  Interventions implemented related to continuation of hospitalization: mood stabilization, medication monitoring and adjustment, group therapy and psycho education, suicide risk assessment, collateral contact, aftercare planning, ongoing physician assessments and safety checks q 15 mins  Additional comments: N/A  Estimated length of stay: 1-2 days  Discharge Plan: CSW is assessing for appropriate referrals.  Pt states that he now wants to go home first before going to long term treatment.    New goal(s): N/A  Review of initial/current patient goals per problem list:    1.  Goal(s): Address substance use by completing detox protocol  Met:  No  Target date: 4 days  As evidenced by: 12/12  Attendees: Patient:     Family:     Physician: Geoffery Lyons, MD 03/27/2012 9:45 AM   Nursing: Roswell Miners, RN 03/27/2012 9:45 AM   Clinical Social Worker:  [name], LCSWA 03/27/2012  9:45 AM   Other: Berneice Heinrich, RN 03/27/2012  9:45 AM   Other:  Nanine Means, RN 03/27/2012 9:47 AM   Other:  Verna Czech, LCSW 03/27/2012 9:48 AM   Other:  Olivia Mackie, Psyc intern 03/27/2012 9:48 AM   Other:      Scribe for Treatment Team:   [name] 03/27/2012 9:45 AM

## 2012-03-27 NOTE — Clinical Social Work Note (Signed)
[facility] LCSW Aftercare Discharge Planning Group Note  03/27/2012 8:45 AM  Participation Quality:  Appropriate and Attentive  Affect:  Anxious and Appropriate  Cognitive:  Alert and Appropriate  Insight:  Developing/Improving  Engagement in Group:  Engaged  Modes of Intervention:  Clarification, Discussion, Education, Exploration, Orientation, Problem-solving, Rapport Building, Socialization and Support  Summary of Progress/Problems: Pt attended discharge planning group and actively participated in group.  CSW provided pt with today's workbook.  Pt presents with anxious mood and affect.  Pt rates depression at a 0 and anxiety at a 3-4 today.  Pt denies SI/HI.  Pt states that he doesn't want to go to long term treatment straight from here anymore and would prefer to go home, spend time with his family and then seek long term treatment.  Pt denied referral to [facility] at this time.  Pt wants to follow up at [facility] for medication management and therapy.  No further needs voiced by pt at this time.    [name], LCSWA 03/27/2012 10:01 AM

## 2012-03-27 NOTE — Progress Notes (Signed)
[facility] MD Progress Note  03/27/2012 3:08 PM Joshua Dillon  MRN:  [id number] Subjective: Dione still endorses persistent anxiety, mood instability. Feels that the Seroquel is helping, but would like to see if he can get more benefite from it, by increasing the dose. Feels better on it than on the Depakote. Admits that he cant quit thinking about the past, how he smoked pot shortly before he was to be released from the service and he was drug tested, and discharged less than honorary. 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 He is looking back at all the failed relationships, the way he treated women "I was a dog." Lots of anger towards himself and self condemnation. He had an earlier disagreement with his father on the phone and he lost control while in his room.  Diagnosis:   Axis I: Alcohol Dependence, Mood Disorder NOS Axis II: Deferred Axis III:  Past Medical History  Diagnosis Date  . Depression   . ADHD (attention deficit hyperactivity disorder)   . Anxiety   . Alcoholism   . Seizure 03/2012    Secondary to tramadol.   Axis IV: economic problems, educational problems, housing problems, occupational problems and problems with primary support group Axis V: 51-60 moderate symptoms  ADL's:  Intact  Sleep: Fair  Appetite:  Fair  Suicidal Ideation:  Plan:  Denies Intent:  Denies Means:  Denies Homicidal Ideation:  Plan:  Denies Intent:  Denies Means:  Denies AEB (as evidenced by):  Psychiatric Specialty Exam: Review of Systems  Constitutional: Negative.   HENT: Negative.   Eyes: Negative.   Respiratory: Negative.   Cardiovascular: Negative.   Gastrointestinal: Negative.   Genitourinary: Negative.   Musculoskeletal: Negative.   Skin: Negative.   Neurological: Negative.   Endo/Heme/Allergies: Negative.   Psychiatric/Behavioral: Positive for substance abuse. The patient is nervous/anxious.      Blood pressure 107/68, pulse 81, temperature 97.2 F (36.2 C), temperature source Oral, resp. rate 20, height 5\' 11"  (1.803 m), weight 88.905 kg (196 lb).Body mass index is 27.34 kg/(m^2).  General Appearance: Fairly Groomed  Patent attorney::  Fair  Speech:  Clear and Coherent  Volume:  Normal  Mood:  Anxious, Dysphoric, Irritable and Worthless  Affect:  irritated, frustratred  Thought Process:  Coherent and Goal Directed  Orientation:  Full (Time, Place, and Person)  Thought Content:  regrets, ruminations, shame, guilt, fears, worried  Suicidal Thoughts:  No  Homicidal Thoughts:  No  Memory:  Immediate;   Fair Recent;   Fair Remote;   Fair  Judgement:  Fair  Insight:  Present  Psychomotor Activity:  Restlessness  Concentration:  Fair  Recall:  Fair  Akathisia:  No  Handed:  Right  AIMS (if indicated):     Assets:  Desire for Improvement  Sleep:  Number of Hours: 6    Current Medications: Current Facility-Administered Medications  Medication Dose Route Frequency Provider Last Rate Last Dose  . acetaminophen (TYLENOL) tablet 650 mg  650 mg Oral Q6H PRN Sanjuana Kava, NP   650 mg at 03/26/12 0818  . alum & mag hydroxide-simeth (MAALOX/MYLANTA) 200-200-20 MG/5ML suspension 30 mL  30 mL Oral Q4H PRN Sanjuana Kava, NP      . [COMPLETED] chlordiazePOXIDE (LIBRIUM) capsule 25 mg  25 mg Oral BH-qamhs Sanjuana Kava, NP   25 mg at 03/26/12 2158   Followed by  . [COMPLETED] chlordiazePOXIDE (  LIBRIUM) capsule 25 mg  25 mg Oral Daily Sanjuana Kava, NP   25 mg at 03/27/12 0755  . FLUoxetine (PROZAC) capsule 40 mg  40 mg Oral BID Sanjuana Kava, NP   40 mg at 03/27/12 0755  . magnesium hydroxide (MILK OF MAGNESIA) suspension 30 mL  30 mL Oral Daily PRN Sanjuana Kava, NP      . multivitamin with minerals tablet 1 tablet  1 tablet Oral Daily Sanjuana Kava, NP   1 tablet at 03/27/12 0755  . nicotine polacrilex (NICORETTE) gum 2 mg  2 mg Oral PRN Kerry Hough, PA   2 mg at 03/27/12 0804  .  pantoprazole (PROTONIX) EC tablet 20 mg  20 mg Oral Daily Jorje Guild, PA-C   20 mg at 03/27/12 0755  . QUEtiapine (SEROQUEL) tablet 150 mg  150 mg Oral TID Rachael Fee, MD   150 mg at 03/27/12 1200  . QUEtiapine (SEROQUEL) tablet 150 mg  150 mg Oral QHS Rachael Fee, MD      . QUEtiapine (SEROQUEL) tablet 50 mg  50 mg Oral Once Rachael Fee, MD      . thiamine (B-1) injection 100 mg  100 mg Intramuscular Once Sanjuana Kava, NP      . thiamine (VITAMIN B-1) tablet 100 mg  100 mg Oral Daily Sanjuana Kava, NP   100 mg at 03/27/12 0755  . traZODone (DESYREL) tablet 50 mg  50 mg Oral QHS PRN,MR X 1 Jorje Guild, PA-C   50 mg at 03/26/12 2252  . [DISCONTINUED] QUEtiapine (SEROQUEL) tablet 100 mg  100 mg Oral QHS Rachael Fee, MD   100 mg at 03/26/12 2159  . [DISCONTINUED] QUEtiapine (SEROQUEL) tablet 100 mg  100 mg Oral TID PRN Rachael Fee, MD   100 mg at 03/27/12 0757    Lab Results: No results found for this or any previous visit (from the past 48 hour(s)).  Physical Findings: AIMS: Facial and Oral Movements Muscles of Facial Expression: None, normal Lips and Perioral Area: None, normal Jaw: None, normal Tongue: None, normal,Extremity Movements Upper (arms, wrists, hands, fingers): None, normal Lower (legs, knees, ankles, toes): None, normal, Trunk Movements Neck, shoulders, hips: None, normal, Overall Severity Severity of abnormal movements (highest score from questions above): None, normal Incapacitation due to abnormal movements: None, normal Patient's awareness of abnormal movements (rate only patient's report): No Awareness, Dental Status Current problems with teeth and/or dentures?: Yes Does patient usually wear dentures?: No  CIWA:  CIWA-Ar Total: 3  COWS:     Treatment Plan Summary: Daily contact with patient to assess and evaluate symptoms and progress in treatment Medication management  Plan: Will work on Pharmacologist, relapse prevention, anger management, stress  management, relapse prevention           Will increase the Seroquel to 150 mg TID and HS  Medical Decision Making Problem Points:  Established problem, stable/improving (1), Review of last therapy session (1) and Review of psycho-social stressors (1) Data Points:  Review of medication regiment & side effects (2) Review of new medications or change in dosage (2)  I certify that inpatient services furnished can reasonably be expected to improve the patient's condition.   Django Nguyen A 03/27/2012, 3:08 PM

## 2012-03-28 DIAGNOSIS — F411 Generalized anxiety disorder: Secondary | ICD-10-CM

## 2012-03-28 DIAGNOSIS — F909 Attention-deficit hyperactivity disorder, unspecified type: Secondary | ICD-10-CM

## 2012-03-28 MED ORDER — QUETIAPINE FUMARATE 50 MG PO TABS: 150.0000 mg | ORAL_TABLET | Freq: Every day | ORAL | Status: DC

## 2012-03-28 MED ORDER — QUETIAPINE FUMARATE 300 MG PO TABS
150.0000 mg | ORAL_TABLET | Freq: Four times a day (QID) | ORAL | Status: DC
  Filled 2012-03-28 (×5): qty 28

## 2012-03-28 MED ORDER — ADULT MULTIVITAMIN W/MINERALS CH: 1.0000 | ORAL_TABLET | Freq: Every day | ORAL | Status: DC

## 2012-03-28 MED ORDER — TRAZODONE HCL 50 MG PO TABS: 50.0000 mg | ORAL_TABLET | Freq: Every evening | ORAL | Status: DC | PRN

## 2012-03-28 MED ORDER — ASPIRIN-SALICYLAMIDE-CAFFEINE 325-95-16 MG PO TABS: 1.0000 | ORAL_TABLET | ORAL | Status: DC | PRN

## 2012-03-28 MED ORDER — QUETIAPINE FUMARATE 50 MG PO TABS: 150.0000 mg | ORAL_TABLET | Freq: Three times a day (TID) | ORAL | Status: DC

## 2012-03-28 MED ORDER — PANTOPRAZOLE SODIUM 20 MG PO TBEC: 20.0000 mg | DELAYED_RELEASE_TABLET | Freq: Every day | ORAL | Status: DC

## 2012-03-28 MED ORDER — FLUOXETINE HCL 40 MG PO CAPS: 40.0000 mg | ORAL_CAPSULE | Freq: Two times a day (BID) | ORAL | Status: DC

## 2012-03-28 NOTE — Tx Team (Signed)
Interdisciplinary Treatment Plan Update (Adult)  Date:  03/28/2012  Time Reviewed:  10:54 AM   Progress in Treatment: Attending groups: Yes Participating in groups:  Yes Taking medication as prescribed: Yes Tolerating medication:  Yes Family/Significant othe contact made:  Yes Patient understands diagnosis:  Yes Discussing patient identified problems/goals with staff:  Yes Medical problems stabilized or resolved:  Yes Denies suicidal/homicidal ideation: Yes Issues/concerns per patient self-inventory:  None identified Other: N/A  New problem(s) identified: None Identified  Reason for Continuation of Hospitalization: Stable to d/c  Interventions implemented related to continuation of hospitalization: Stable to d/c  Additional comments: N/A  Estimated length of stay: D/C today  Discharge Plan: Pt will follow up with [facility] for medication management and therapy.    New goal(s): N/A  Review of initial/current patient goals per problem list:    1.  Goal(s): Address substance use by completing detox protocol  Met:  Yes  Target date: today   As evidenced by: Detox protocol completed today    Attendees: Patient:   03/28/2012 10:54 AM   Family:     Physician:  Geoffery Lyons, MD 03/28/2012 10:54 AM   Nursing:  03/28/2012 10:54 AM   Clinical Social Worker:  [name], LCSWA 03/28/2012 10:54 AM   Other:  03/28/2012 10:54 AM   Other:     Other:     Other:     Other:      Scribe for Treatment Team:   [name] 03/28/2012 10:54 AM

## 2012-03-28 NOTE — Progress Notes (Signed)
Patient denies SI/HI, denies A/V hallucinations. Patient verbalizes understanding of discharge instructions, follow up care and prescriptions. Patient given all belongings from [facility] locker. Patient escorted out by staff, transported by family. 

## 2012-03-28 NOTE — Clinical Social Work Note (Signed)
[facility] LCSW Group Therapy  03/28/2012  1:15 PM   Type of Therapy:  Group Therapy  Participation Level:  Active  Participation Quality:  Appropriate and Attentive  Affect:  Appropriate  Cognitive:  Alert and Appropriate  Insight:  Developing/Improving  Engagement in Therapy:  Developing/Improving  Modes of Intervention:  Discussion, Education, Exploration, Limit-setting, Role-play, Socialization and Support  Summary of Progress/Problems: The topic for group was balance in life.  Pt participated in the discussion about when their life was in balance and out of balance and how this feels.  Pt discussed ways to get back in balance and short term goals they can work on to get where they want to be. Pt discussed feeling like is at a stand still in life.  Pt states that he has never had balance in his life.  Pt states that he wants to get sober for his mom.    Joshua Dillon, LCSWA 03/28/2012 2:14 PM

## 2012-03-28 NOTE — Progress Notes (Signed)
Patient ID: Joshua Dillon, male   DOB: [birth year], 26 y.o.   MRN: [id number]  D: Pt was asked about his day. Pt informed the writer that the Dr increased his seroquel and that his anxiety has been "a lot better". "I'm a world different".  Stated that his liver enzymes show, "he can't drink any more". Informed the writer that he's going to [facility], but wants to spend the holiday with his family.  A: Encouraged pt to think twice about going home rather than [facility]. 15 min checks continued for safety.  R: Pt remains safe.

## 2012-03-28 NOTE — Clinical Social Work Note (Signed)
[facility] LCSW Aftercare Discharge Planning Group Note  03/28/2012 8:45 AM  Participation Quality:  Appropriate and Attentive  Affect:  Appropriate  Cognitive:  Alert and Appropriate  Insight:  Developing/Improving  Engagement in Group:  Developing/Improving  Modes of Intervention:  Clarification, Discussion, Education, Exploration, Orientation, Problem-solving, Rapport Building, Socialization and Support  Summary of Progress/Problems: Pt attended discharge planning group and actively participated in group.  CSW provided pt with today's workbook.  Pt presents with calm mood and affect.  Pt rates depression and anxiety at a 2-3 today.  Pt denies SI/HI.  Pt states that his d/c plans are to go home for a few days to spend time with his family before going to [facility] for further treatment.  CSW provided pt with the information to call [facility] for bed availability.  Pt asked CSW to speak to pt's father to explain his d/c plans; CSW completed this.  Pt was intrusive during group and had to be redirected multiple times.  No further needs voiced by pt at this time.  Safety planning and suicide prevention discussed.  Pt participated in discussion and acknowledged an understanding of the information provided.       Joshua Dillon, [place] 03/28/2012 9:39 AM

## 2012-03-28 NOTE — [facility] Suicide Risk Assessment (Signed)
Suicide Risk Assessment  Discharge Assessment     Demographic Factors:  Adolescent or young adult, Caucasian and Unemployed  Mental Status Per Nursing Assessment::   On Admission:  NA  Current Mental Status by Physician: In full contact with reality. There are no suicidal ideas, plans or intent. He has tolerated the medications well and he feels much better on them. His mood is more euthymic, his affect is appropriate. He wants to go home and wait for a bed to come open at [facility]. He is going to stay with his father, this is a stable place for him   Loss Factors: Financial problems/change in socioeconomic status  Historical Factors: NA  Risk Reduction Factors:   Sense of responsibility to family and Living with another person, especially a relative  Continued Clinical Symptoms:  Bipolar Disorder:   Depressive phase Alcohol/Substance Abuse/Dependencies  Cognitive Features That Contribute To Risk:  Thought constriction (tunnel vision)    Suicide Risk:  Minimal: No identifiable suicidal ideation.  Patients presenting with no risk factors but with morbid ruminations; may be classified as minimal risk based on the severity of the depressive symptoms  Discharge Diagnoses:   AXIS I:  Alcohol Dependence, Mood Disorder NOS, Anxiety Disorder NOS ADHD AXIS II:  Deferred AXIS III:   Past Medical History  Diagnosis Date  . Depression   . ADHD (attention deficit hyperactivity disorder)   . Anxiety   . Alcoholism   . Seizure 03/2012    Secondary to tramadol.   AXIS IV:  economic problems and occupational problems AXIS V:  61-70 mild symptoms  Plan Of Care/Follow-up recommendations:  Activity:  As tolerated Diet:  Regular [facility] as soon as bed available  Is patient on multiple antipsychotic therapies at discharge:  No   Has Patient had three or more failed trials of antipsychotic monotherapy by history:  No  Recommended Plan for Multiple Antipsychotic Therapies:  N/A   Joshua Dillon A 03/28/2012, 12:13 PM

## 2012-03-28 NOTE — Discharge Summary (Signed)
Physician Discharge Summary Note  Patient:  Joshua Dillon is an 26 y.o., male MRN:  [id number] DOB:  1985/08/05 Patient phone:  [phone] (home)  Patient address:   [address] [place] [address],   Date of Admission:  03/23/2012 Date of Discharge: 03/28/2012  Reason for Admission:  Alcohol dependency/detox, depression  Discharge Diagnoses: Active Problems:  Alcohol dependence  Mood disorder  Review of Systems  Constitutional: Negative.   HENT: Negative.   Eyes: Negative.   Respiratory: Negative.   Cardiovascular: Negative.   Gastrointestinal: Negative.   Genitourinary: Negative.   Musculoskeletal: Negative.   Skin: Negative.   Neurological: Negative.   Endo/Heme/Allergies: Negative.   Psychiatric/Behavioral: Positive for substance abuse.   Axis Diagnosis:   AXIS I:  Alcohol Abuse, Depressive Disorder NOS, Substance Abuse and Substance Induced Mood Disorder AXIS II:  Deferred AXIS III:   Past Medical History  Diagnosis Date  . Depression   . ADHD (attention deficit hyperactivity disorder)   . Anxiety   . Alcoholism   . Seizure 03/2012    Secondary to tramadol.   AXIS IV:  economic problems, occupational problems, other psychosocial or environmental problems, problems related to social environment and problems with primary support group AXIS V:  61-70 mild symptoms  Level of Care:  IOP  Hospital Course:  Patient presented with wanted help with alcohol dependence and detox--librium protocol started and completed successfully.  Joshua Dillon was also started on Seroquel for his mood swings and paranoia.  Trazodone was also implemented PRN for sleep.  Protonix ordered for his GERD issues and a multivitamins for supplementation.  Joshua Dillon denies suicidal/homicidal ideations and auditory/visual hallucinations, no delusions or paranoia.  Patient will continue his treatment in IOP and AA meetings.  His mother will pick him up and he will stay with her--clean  house.  Consults:  None  Significant Diagnostic Studies:  labs: completed and reviewed, stable  Discharge Vitals:   Blood pressure 114/69, pulse 98, temperature 97.2 F (36.2 C), temperature source Oral, resp. rate 20, height 5\' 11"  (1.803 m), weight 88.905 kg (196 lb). Body mass index is 27.34 kg/(m^2). Lab Results:   No results found for this or any previous visit (from the past 72 hour(s)).  Physical Findings: AIMS: Facial and Oral Movements Muscles of Facial Expression: None, normal Lips and Perioral Area: None, normal Jaw: None, normal Tongue: None, normal,Extremity Movements Upper (arms, wrists, hands, fingers): None, normal Lower (legs, knees, ankles, toes): None, normal, Trunk Movements Neck, shoulders, hips: None, normal, Overall Severity Severity of abnormal movements (highest score from questions above): None, normal Incapacitation due to abnormal movements: None, normal Patient's awareness of abnormal movements (rate only patient's report): No Awareness, Dental Status Current problems with teeth and/or dentures?: No Does patient usually wear dentures?: No  CIWA:  CIWA-Ar Total: 0  COWS:     Psychiatric Specialty Exam: See Psychiatric Specialty Exam and Suicide Risk Assessment completed by Attending Physician prior to discharge.  Discharge destination:  Home  Is patient on multiple antipsychotic therapies at discharge:  No   Has Patient had three or more failed trials of antipsychotic monotherapy by history:  No Recommended Plan for Multiple Antipsychotic Therapies:  N/A  Discharge Orders    Future Orders Please Complete By Expires   Diet - low sodium heart healthy      Activity as tolerated - No restrictions          Medication List     As of 03/28/2012 10:20 AM  TAKE these medications      Indication    Aspirin-Salicylamide-Caffeine 325-95-16 MG Tabs   Take 1 packet by mouth as needed.    Indication: headaches      FLUoxetine 40 MG capsule    Commonly known as: PROZAC   Take 1 capsule (40 mg total) by mouth 2 (two) times daily.    Indication: Depression      multivitamin with minerals Tabs   Take 1 tablet by mouth daily.    Indication: vitamin deficiency      pantoprazole 20 MG tablet   Commonly known as: PROTONIX   Take 1 tablet (20 mg total) by mouth daily.    Indication: Gastroesophageal Reflux Disease      QUEtiapine 50 MG tablet   Commonly known as: SEROQUEL   Take 3 tablets (150 mg total) by mouth 3 (three) times daily.    Indication: Depressive Phase of Manic-Depression      QUEtiapine 50 MG tablet   Commonly known as: SEROQUEL   Take 3 tablets (150 mg total) by mouth at bedtime.    Indication: Depressive Phase of Manic-Depression, Trouble Sleeping      traZODone 50 MG tablet   Commonly known as: DESYREL   Take 1 tablet (50 mg total) by mouth at bedtime as needed and may repeat dose one time if needed for sleep.          Follow-up recommendations:  Activity as tolerated, low-sodium heart healthy diet  Comments:  Patient's family is supportive and he will go live with his mother for awhile, follow-up with IOP and AA.  Total Discharge Time:  Greater than 30 minutes  Signed: Nanine Means, PMH-NP 03/28/2012, 10:20 AM

## 2012-03-28 NOTE — Progress Notes (Signed)
[facility] Adult Case Management Discharge Plan :  Will you be returning to the same living situation after discharge: Yes,  returning home At discharge, do you have transportation home?:Yes,  access to transportation Do you have the ability to pay for your medications:Yes,  access to meds  Release of information consent forms completed and in the chart;  Patient's signature needed at discharge.  Patient to Follow up at: Follow-up Information    Follow up with [facility]. On 04/01/2012. (Appointment scheduled at 8:00 am, referral # [id number])    Contact information:   Physical Address: [address] [place], [place] [address]          Patient denies SI/HI:   Yes,  denies SI/HI    Safety Planning and Suicide Prevention discussed:  Yes,  discussed with pt  No recommendations from CSW.  No further needs voiced by pt.  Pt stable to discharge.    Carmina Miller 03/28/2012, 10:53 AM

## 2012-04-02 NOTE — Discharge Summary (Signed)
Agree with assessment and plan Baily Hovanec A. Ellie Spickler, M.D. 

## 2012-04-11 ENCOUNTER — Emergency Department (HOSPITAL_COMMUNITY)
Admission: EM | Admit: 2012-04-11 | Discharge: 2012-04-11 | Disposition: A | Discharge status: Home / Self Care | Payer: Self-pay | Attending: Emergency Medicine | Admitting: Emergency Medicine

## 2012-04-11 ENCOUNTER — Encounter (HOSPITAL_COMMUNITY): Payer: Self-pay

## 2012-04-11 DIAGNOSIS — F411 Generalized anxiety disorder: Secondary | ICD-10-CM | POA: Insufficient documentation

## 2012-04-11 DIAGNOSIS — Z79899 Other long term (current) drug therapy: Secondary | ICD-10-CM | POA: Insufficient documentation

## 2012-04-11 DIAGNOSIS — Z8669 Personal history of other diseases of the nervous system and sense organs: Secondary | ICD-10-CM | POA: Insufficient documentation

## 2012-04-11 DIAGNOSIS — F17200 Nicotine dependence, unspecified, uncomplicated: Secondary | ICD-10-CM | POA: Insufficient documentation

## 2012-04-11 DIAGNOSIS — K0889 Other specified disorders of teeth and supporting structures: Secondary | ICD-10-CM

## 2012-04-11 DIAGNOSIS — F329 Major depressive disorder, single episode, unspecified: Secondary | ICD-10-CM | POA: Insufficient documentation

## 2012-04-11 DIAGNOSIS — F1020 Alcohol dependence, uncomplicated: Secondary | ICD-10-CM | POA: Insufficient documentation

## 2012-04-11 DIAGNOSIS — K089 Disorder of teeth and supporting structures, unspecified: Secondary | ICD-10-CM | POA: Insufficient documentation

## 2012-04-11 DIAGNOSIS — F909 Attention-deficit hyperactivity disorder, unspecified type: Secondary | ICD-10-CM | POA: Insufficient documentation

## 2012-04-11 DIAGNOSIS — F1010 Alcohol abuse, uncomplicated: Secondary | ICD-10-CM

## 2012-04-11 DIAGNOSIS — F3289 Other specified depressive episodes: Secondary | ICD-10-CM | POA: Insufficient documentation

## 2012-04-11 LAB — HEPATIC FUNCTION PANEL
ALT: 198 U/L — ABNORMAL HIGH (ref 0–53)
AST: 79 U/L — ABNORMAL HIGH (ref 0–37)
Albumin: 3.7 g/dL (ref 3.5–5.2)
Alkaline Phosphatase: 79 U/L (ref 39–117)
Bilirubin, Direct: <0.1 mg/dL (ref 0.0–0.3)
Total Bilirubin: 0.1 mg/dL — ABNORMAL LOW (ref 0.3–1.2)

## 2012-04-11 LAB — BASIC METABOLIC PANEL
CO2: 25 mEq/L (ref 19–32)
Calcium: 8.5 mg/dL (ref 8.4–10.5)
Creatinine, Ser: 1.16 mg/dL (ref 0.50–1.35)
GFR calc non Af Amer: 84 mL/min — ABNORMAL LOW (ref >90)
Glucose, Bld: 103 mg/dL — ABNORMAL HIGH (ref 70–99)
Sodium: 138 mEq/L (ref 135–145)

## 2012-04-11 LAB — CBC WITH DIFFERENTIAL/PLATELET
Basophils Absolute: 0 10*3/uL (ref 0.0–0.1)
Eosinophils Absolute: 0 10*3/uL (ref 0.0–0.7)
Eosinophils Relative: 1 % (ref 0–5)
HCT: 39.9 % (ref 39.0–52.0)
Lymphs Abs: 1.6 10*3/uL (ref 0.7–4.0)
MCH: 32.7 pg (ref 26.0–34.0)
MCV: 94.5 fL (ref 78.0–100.0)
Monocytes Absolute: 0.7 10*3/uL (ref 0.1–1.0)
Platelets: 229 10*3/uL (ref 150–400)
RDW: 12.2 % (ref 11.5–15.5)

## 2012-04-11 LAB — PROTIME-INR: Prothrombin Time: 12.9 seconds (ref 11.6–15.2)

## 2012-04-11 MED ORDER — AMOXICILLIN 500 MG PO CAPS: 500.0000 mg | ORAL_CAPSULE | Freq: Three times a day (TID) | ORAL | Status: DC

## 2012-04-11 NOTE — ED Notes (Signed)
Per pt, " Im here for medical Clearance so I can go to [facility]" last drink was night. Denies si/hi

## 2012-04-11 NOTE — BH Assessment (Signed)
Assessment Note   Joshua Dillon is an 26 y.o. male.  Presented to [facility] for medical clearance stating that he wants to continue treatment for alcohol dependence at [facility]. Deneis SI/HI, is not delusional or psychotic.  Had treatment for substance abuse and anxiety at [facility] December 2013, but did not go to [facility] (which they recommended) when he was discharged 2 weeks ago after having treatment for approximately 1 week. Drank vodka until he was drunk last night. Appears sober today, A&O x4.   Axis I: Substance Abuse Axis II: Deferred Axis III:  Past Medical History  Diagnosis Date  . Depression   . ADHD (attention deficit hyperactivity disorder)   . Anxiety   . Alcoholism   . Seizure 03/2012    Secondary to tramadol.   Axis IV: economic problems, other psychosocial or environmental problems and problems with access to health care services Axis V: 51-60 moderate symptoms  Past Medical History:  Past Medical History  Diagnosis Date  . Depression   . ADHD (attention deficit hyperactivity disorder)   . Anxiety   . Alcoholism   . Seizure 03/2012    Secondary to tramadol.    History reviewed. No pertinent past surgical history.  Family History:  Family History  Problem Relation Age of Onset  . Drug abuse Mother     narcotics  . Alcohol abuse Paternal Grandmother   . Anxiety disorder Father   . Bipolar disorder Mother     Social History:  reports that he has been smoking Cigarettes.  He has a 2.4 pack-year smoking history. His smokeless tobacco use includes Snuff and Chew. He reports that he drinks about 16.5 ounces of alcohol per week. He reports that he uses illicit drugs (Marijuana and Benzodiazepines) about 7 times per week.  Additional Social History:     CIWA: CIWA-Ar BP: 145/79 mmHg Pulse Rate: 104  COWS:    Allergies: No Known Allergies  Home Medications:  (Not in a hospital admission)  OB/GYN Status:  No LMP for male patient.  General Assessment  Data Location of Assessment: [facility] Assessment Services ACT Assessment: Yes Living Arrangements: Alone;Parent Can pt return to current living arrangement?: Yes Admission Status: Voluntary Is patient capable of signing voluntary admission?: Yes Transfer from: Home Referral Source: Self/Family/Friend  Education Status Is patient currently in school?: No  Risk to self Suicidal Ideation: No Suicidal Intent: No Is patient at risk for suicide?: No Suicidal Plan?: No Access to Means: No What has been your use of drugs/alcohol within the last 12 months?:  (drank until drunk last night after 3 weeks sobriety) Substance abuse history and/or treatment for substance abuse?: No                    Prior Inpatient Therapy Prior Inpatient Therapy: Yes Prior Therapy Dates: 03/2012 Prior Therapy Facilty/Provider(s): [facility] Reason for Treatment: psych/alcohol               Values / Beliefs Cultural Requests During Hospitalization: None Spiritual Requests During Hospitalization: None              Disposition:  Disposition Disposition of Patient: Referred to Patient referred to: [facility] (pt requests to go to [facility])  On Site Evaluation by:   Reviewed with Physician:     Genia Del 04/11/2012 1:08 PM

## 2012-04-11 NOTE — ED Notes (Signed)
Pt wanded by security at arrival, belongings bagged and placed in lock-up.  Pt calm at present. Awaiting md

## 2012-04-11 NOTE — ED Provider Notes (Signed)
History    This chart was scribed for Joshua Dillon, *, MD by Smitty Pluck, ED Scribe. The patient was seen in room APAH8 and the patient's care was started at 1:07 PM.   CSN: [id number]  Arrival date & time 04/11/12  1155      Chief Complaint  Patient presents with  . V70.1    (Consider location/radiation/quality/duration/timing/severity/associated sxs/prior treatment) The history is provided by the patient. No language interpreter was used.   Joshua Dillon is a 26 y.o. male with hx of depression, ADHD, anxiety and alcoholism who presents to the Emergency Department for medical clearance. Pt reports that he wants detox from alcohol. Pt reports that he was talked to staff at [facility] He denies nausea, vomiting, fever, chills, abdominal pain and any other symptoms. He states that he has been to [facility] and [facility] in past. He reports he last had alcohol last night. Denies SI and HI. Reports he has top back dental pain onset 3 weeks ago.   Past Medical History  Diagnosis Date  . Depression   . ADHD (attention deficit hyperactivity disorder)   . Anxiety   . Alcoholism   . Seizure 03/2012    Secondary to tramadol.    History reviewed. No pertinent past surgical history.  Family History  Problem Relation Age of Onset  . Drug abuse Mother     narcotics  . Alcohol abuse Paternal Grandmother   . Anxiety disorder Father   . Bipolar disorder Mother     History  Substance Use Topics  . Smoking status: Current Every Day Smoker -- 0.3 packs/day for 8 years    Types: Cigarettes  . Smokeless tobacco: Current User    Types: Snuff, Chew  . Alcohol Use: 16.5 oz/week    33 drink(s) per week     Comment: daily      Review of Systems  Constitutional: Negative for fever and chills.  HENT: Positive for dental problem.   Respiratory: Negative for shortness of breath.   Cardiovascular: Negative for chest pain.  Gastrointestinal: Negative for nausea,  vomiting and abdominal pain.  Musculoskeletal: Negative for back pain.  Skin: Negative for rash.  Neurological: Negative for weakness.  Psychiatric/Behavioral: Negative for suicidal ideas.  All other systems reviewed and are negative.    Allergies  Review of patient's allergies indicates no known allergies.  Home Medications   Current Outpatient Rx  Name  Route  Sig  Dispense  Refill  . ASPIRIN-SALICYLAMIDE-CAFFEINE 325-95-16 MG PO TABS   Oral   Take 1 packet by mouth as needed.   30 each   0   . FLUOXETINE HCL 40 MG PO CAPS   Oral   Take 1 capsule (40 mg total) by mouth 2 (two) times daily.   60 capsule   0   . ADULT MULTIVITAMIN W/MINERALS CH   Oral   Take 1 tablet by mouth daily.   30 tablet   0   . PANTOPRAZOLE SODIUM 20 MG PO TBEC   Oral   Take 1 tablet (20 mg total) by mouth daily.   30 tablet   0   . QUETIAPINE FUMARATE 50 MG PO TABS   Oral   Take 3 tablets (150 mg total) by mouth 3 (three) times daily.   270 tablet   0   . QUETIAPINE FUMARATE 50 MG PO TABS   Oral   Take 3 tablets (150 mg total) by mouth at bedtime.   90 tablet  0   . TRAZODONE HCL 50 MG PO TABS   Oral   Take 1 tablet (50 mg total) by mouth at bedtime as needed and may repeat dose one time if needed for sleep.   30 tablet   0     BP 145/79  Pulse 104  Temp 98.1 F (36.7 C)  Resp 18  Wt 200 lb (90.719 kg)  SpO2 100%  Physical Exam  Nursing note and vitals reviewed. Constitutional: He is oriented to person, place, and time. He appears well-developed and well-nourished. No distress.  HENT:  Head: Normocephalic and atraumatic.  Eyes: EOM are normal. Pupils are equal, round, and reactive to light.  Neck: Normal range of motion. Neck supple. No tracheal deviation present.  Cardiovascular: Normal rate, regular rhythm and normal heart sounds.   Pulmonary/Chest: Effort normal and breath sounds normal. No respiratory distress. He has no wheezes. He has no rales.  Abdominal:  Soft. Bowel sounds are normal. He exhibits no distension. There is no tenderness. There is no rebound.  Musculoskeletal: Normal range of motion.  Neurological: He is alert and oriented to person, place, and time. He has normal strength.  Skin: Skin is warm and dry.  Psychiatric: He has a normal mood and affect. His behavior is normal.    ED Course  Procedures (including critical care time) DIAGNOSTIC STUDIES: Oxygen Saturation is 100% on room air, normal by my interpretation.    COORDINATION OF CARE: 1:10 PM Discussed ED treatment with pt     Labs Reviewed  CBC WITH DIFFERENTIAL - Abnormal; Notable for the following:    Monocytes Relative 15 (*)     All other components within normal limits  BASIC METABOLIC PANEL - Abnormal; Notable for the following:    Glucose, Bld 103 (*)     GFR calc non Af Amer 84 (*)     All other components within normal limits  HEPATIC FUNCTION PANEL - Abnormal; Notable for the following:    AST 79 (*)     ALT 198 (*)     Total Bilirubin 0.1 (*)     All other components within normal limits  ETHANOL  PROTIME-INR  URINE RAPID DRUG SCREEN (HOSP PERFORMED)   No results found.   No diagnosis found.    MDM  Patient referred to ER by [facility] mobile crisis. He reports a previous history of alcoholism. He was recently admitted for detox 3 weeks ago. He reports that he had not drank until last night since his discharge from detox. He talked to his counselor today and was told to come to the ER for "medical clearance". His examination was unremarkable. Blood work was unremarkable. Patient indicates that he wants to go to [facility] for detox. [facility] was contacted. There are no beds. Patient has followup at [facility] in the morning. He is encouraged to follow up in the morning. He does not require emergent hospitalization at this time. He is not at risk for withdrawal after one night of drinking.  She also indicates that he is having pain in the left upper  mouth area. He reports a tooth crack while he was flossing. There is no sign of dental abscess.  I personally performed the services described in this documentation, which was scribed in my presence. The recorded information has been reviewed and is accurate.       Joshua Crease, MD 04/11/12 2033

## 2012-07-12 ENCOUNTER — Emergency Department (HOSPITAL_COMMUNITY)
Admission: EM | Admit: 2012-07-12 | Discharge: 2012-07-12 | Disposition: A | Discharge status: Home / Self Care | Payer: Self-pay | Attending: Emergency Medicine | Admitting: Emergency Medicine

## 2012-07-12 DIAGNOSIS — F1010 Alcohol abuse, uncomplicated: Secondary | ICD-10-CM

## 2012-07-12 DIAGNOSIS — Z79899 Other long term (current) drug therapy: Secondary | ICD-10-CM | POA: Insufficient documentation

## 2012-07-12 DIAGNOSIS — F3289 Other specified depressive episodes: Secondary | ICD-10-CM | POA: Insufficient documentation

## 2012-07-12 DIAGNOSIS — Z8659 Personal history of other mental and behavioral disorders: Secondary | ICD-10-CM | POA: Insufficient documentation

## 2012-07-12 DIAGNOSIS — R000 Tachycardia, unspecified: Secondary | ICD-10-CM | POA: Insufficient documentation

## 2012-07-12 DIAGNOSIS — F329 Major depressive disorder, single episode, unspecified: Secondary | ICD-10-CM | POA: Insufficient documentation

## 2012-07-12 DIAGNOSIS — F17200 Nicotine dependence, unspecified, uncomplicated: Secondary | ICD-10-CM | POA: Insufficient documentation

## 2012-07-12 DIAGNOSIS — F411 Generalized anxiety disorder: Secondary | ICD-10-CM | POA: Insufficient documentation

## 2012-07-12 DIAGNOSIS — Z8669 Personal history of other diseases of the nervous system and sense organs: Secondary | ICD-10-CM | POA: Insufficient documentation

## 2012-07-12 DIAGNOSIS — F1020 Alcohol dependence, uncomplicated: Secondary | ICD-10-CM | POA: Insufficient documentation

## 2012-07-12 LAB — RAPID URINE DRUG SCREEN, HOSP PERFORMED
Barbiturates: NOT DETECTED
Benzodiazepines: NOT DETECTED
Cocaine: NOT DETECTED
Tetrahydrocannabinol: NOT DETECTED

## 2012-07-12 LAB — CBC WITH DIFFERENTIAL/PLATELET
Basophils Absolute: 0 10*3/uL (ref 0.0–0.1)
Basophils Relative: 0 % (ref 0–1)
Eosinophils Absolute: 0 10*3/uL (ref 0.0–0.7)
Eosinophils Relative: 0 % (ref 0–5)
Lymphs Abs: 1.1 10*3/uL (ref 0.7–4.0)
MCH: 30.7 pg (ref 26.0–34.0)
MCHC: 34.6 g/dL (ref 30.0–36.0)
MCV: 88.8 fL (ref 78.0–100.0)
Neutrophils Relative %: 67 % (ref 43–77)
Platelets: 198 10*3/uL (ref 150–400)
RBC: 4.36 MIL/uL (ref 4.22–5.81)
RDW: 12.6 % (ref 11.5–15.5)

## 2012-07-12 LAB — COMPREHENSIVE METABOLIC PANEL
ALT: 69 U/L — ABNORMAL HIGH (ref 0–53)
AST: 34 U/L (ref 0–37)
Albumin: 3.3 g/dL — ABNORMAL LOW (ref 3.5–5.2)
Calcium: 8.5 mg/dL (ref 8.4–10.5)
Chloride: 102 mEq/L (ref 96–112)
Creatinine, Ser: 1.11 mg/dL (ref 0.50–1.35)
Sodium: 139 mEq/L (ref 135–145)

## 2012-07-12 LAB — ETHANOL: Alcohol, Ethyl (B): 35 mg/dL — ABNORMAL HIGH (ref 0–11)

## 2012-07-12 MED ORDER — LORAZEPAM 2 MG/ML IJ SOLN: 1.0000 mg | Freq: Once | INTRAMUSCULAR | Status: DC

## 2012-07-12 MED ORDER — SODIUM CHLORIDE 0.9 % IV BOLUS (SEPSIS)
1000.0000 mL | Freq: Once | INTRAVENOUS | Status: AC
  Administered 2012-07-12: 1000 mL via INTRAVENOUS

## 2012-07-12 MED ORDER — LORAZEPAM 2 MG/ML IJ SOLN
2.0000 mg | Freq: Once | INTRAMUSCULAR | Status: AC
  Administered 2012-07-12: 2 mg via INTRAVENOUS
  Filled 2012-07-12: qty 1

## 2012-07-12 MED ORDER — LORAZEPAM 2 MG/ML IJ SOLN
1.0000 mg | Freq: Once | INTRAMUSCULAR | Status: AC
  Administered 2012-07-12: 1 mg via INTRAVENOUS
  Filled 2012-07-12: qty 1

## 2012-07-12 NOTE — BHH Counselor (Signed)
Received a call from Joshua Dillon, Joshua Dillon regarding Joshua Dillon. Sts that he relapsed yesterday binging on alcohol. Ptient reportedly has not drank any alcohol in months. Joshua Dillon asked this Clinical research associate to briefly meet with patient and provide him with substance abuse referrals as patient does not meet criteria for a inpatient detox.   Writer met with patient and he expressed his desire to participate in a detox program after binge drinking 1 day. He also stated that he has no where to live at this time. Patient was kicked out of the [facility] today after failing a alcohol test. Patient was told that he is unable to return for 14 days after seeking detox or treatment. Writer explained to patient that 1 day of drinking would not qualify him for detox, however; other options were available. Patient made aware of support groups (AA), Residential programs, CD-IOP, etc. Patient specifically asked about the residential program at [facility]. Writer provided patient with details about this program and made him an appointment for Monday, July 15, 2012 @ 8am.   Patient also given homeless shelter information to accommodate his housing needs for the next 14 days or until he is able to return to [employer]

## 2012-07-12 NOTE — ED Provider Notes (Signed)
History     CSN: [id number]  Arrival date & time 07/12/12  1017   First MD Initiated Contact with Patient 07/12/12 1047      No chief complaint on file.   (Consider location/radiation/quality/duration/timing/severity/associated sxs/prior treatment) HPI Comments: Tayt Moyers is a 27 y.o. male current everyday smoker with a history of alcohol abuse and depression presenting to the emergency department requesting detox from alcohol.  Patient reports that he relapsed from sobriety yesterday drinking 30-40 ounces of malt liquor.  Patient reports that he successfully completed detox back in December with a mild bump in the road, but was sober from January until yesterday.  He denies current suicidal or homicidal ideations.  Patient did not have a history of DTs or hallucinations from withdrawal.  Typical withdrawal symptoms or tremors, anxiety and nausea.  Patient denies chest pain, shortness of breath, abdominal pain, change in bowel movements, fever, night sweats or chills.  Patient has no other complaints at this time.  The history is provided by the patient.    Past Medical History  Diagnosis Date  . Depression   . ADHD (attention deficit hyperactivity disorder)   . Anxiety   . Alcoholism   . Seizure 03/2012    Secondary to tramadol.    No past surgical history on file.  Family History  Problem Relation Age of Onset  . Drug abuse Mother     narcotics  . Alcohol abuse Paternal Grandmother   . Anxiety disorder Father   . Bipolar disorder Mother     History  Substance Use Topics  . Smoking status: Current Every Day Smoker -- 0.30 packs/day for 8 years    Types: Cigarettes  . Smokeless tobacco: Current User    Types: Snuff, Chew  . Alcohol Use: 16.5 oz/week    33 drink(s) per week     Comment: daily      Review of Systems  All other systems reviewed and are negative.    Allergies  Review of patient's allergies indicates no known allergies.  Home Medications    Current Outpatient Rx  Name  Route  Sig  Dispense  Refill  . acetaminophen (TYLENOL) 500 MG tablet   Oral   Take 500-1,000 mg by mouth every 6 (six) hours as needed for pain.         Marland Kitchen FLUoxetine (PROZAC) 40 MG capsule   Oral   Take 1 capsule (40 mg total) by mouth 2 (two) times daily.   60 capsule   0   . QUEtiapine (SEROQUEL) 50 MG tablet   Oral   Take 3 tablets (150 mg total) by mouth 3 (three) times daily.   270 tablet   0   . QUEtiapine (SEROQUEL) 50 MG tablet   Oral   Take 3 tablets (150 mg total) by mouth at bedtime.   90 tablet   0   . traZODone (DESYREL) 50 MG tablet   Oral   Take 50 mg by mouth at bedtime as needed for sleep.           BP 134/62  Pulse 122  Temp(Src) 98 F (36.7 C) (Oral)  Resp 18  SpO2 96%  Physical Exam  Nursing note and vitals reviewed. Constitutional: He is oriented to person, place, and time. He appears well-developed and well-nourished. No distress.  HENT:  Head: Normocephalic and atraumatic.  Mouth/Throat: Oropharynx is clear and moist. No oropharyngeal exudate.  Eyes: Conjunctivae and EOM are normal. Pupils are equal, round, and reactive  to light. No scleral icterus.  Neck: Normal range of motion. Neck supple. No tracheal deviation present. No thyromegaly present.  Cardiovascular: Regular rhythm, normal heart sounds and intact distal pulses.   Tachycardic   Pulmonary/Chest: Effort normal and breath sounds normal. No stridor. No respiratory distress. He has no wheezes.  Abdominal: Soft.  Musculoskeletal: Normal range of motion. He exhibits no edema and no tenderness.  Neurological: He is alert and oriented to person, place, and time. Coordination normal.  Skin: Skin is warm and dry. No rash noted. He is not diaphoretic. No erythema. No pallor.  Psychiatric: He has a normal mood and affect. His behavior is normal.    ED Course  Procedures (including critical care time)  Labs Reviewed  CBC WITH DIFFERENTIAL   COMPREHENSIVE METABOLIC PANEL  URINE RAPID DRUG SCREEN (HOSP PERFORMED)  ETHANOL   No results found.   No diagnosis found.  Consult ACT team: Pt was kicked out of sober living/halfway house after being caught consuming alcohol.  Patient is unable to return until 14 days past.  Act has scheduled an outpatient [facility] appointment for patient as well as provided him with shelter information.  Patient did not meet criteria for inpatient detox but these only been drinking alcohol for 1 day.  In reviewing pts vitals signs it appears his lowest HR to be 90 with an average around 97. As pt does not have any CP, SOB or signs of DTs tachycardia is not a large concern. Discussed with attending who agrees.    MDM  Alcohol abuse  Patient is a 27 year old male who presented to the emergency department requesting alcohol detox.  Unfortunately patient did not meet criteria for detox of these only been drinking for one day.  Consult act team as above.  Patient to be discharged with outpatient followup scheduled as well as shelter information.  Labs imaging and vitals reviewed finding patient to be mildly tachycardic.  IV fluid bolus & ativan given.  Note that patient does not have suicidal or homicidal ideations prior to discharge.  No other complaints at this time.        Jaci Carrel, [place] 07/12/12 [phone]

## 2012-07-12 NOTE — ED Notes (Signed)
Pt aaox3.  Pt reports being sober of etoh/beer x 1 yr.  Pt reports relapsing last night.  Pt reports being homeless.  Pt reports 80-100 oz beer per day

## 2012-07-12 NOTE — ED Notes (Signed)
Pt present with c/o etoh detox.  Pt last drink was last night.  Pt reports 80 to 100 oz/day of beer..  Pt reports being sober all year and relapsed yesterday.

## 2012-07-12 NOTE — ED Provider Notes (Signed)
Medical screening examination/treatment/procedure(s) were performed by non-physician practitioner and as supervising physician I was immediately available for consultation/collaboration.   Neshia Mckenzie Y. Dnyla Antonetti, MD 07/12/12 1515 

## 2012-07-13 ENCOUNTER — Encounter (HOSPITAL_COMMUNITY): Payer: Self-pay | Admitting: Emergency Medicine

## 2012-07-13 ENCOUNTER — Emergency Department (HOSPITAL_COMMUNITY)
Admission: EM | Admit: 2012-07-13 | Discharge: 2012-07-14 | Disposition: A | Discharge status: Other Acute Inpatient Hospital | Payer: Self-pay | Attending: Emergency Medicine | Admitting: Emergency Medicine

## 2012-07-13 DIAGNOSIS — Z8659 Personal history of other mental and behavioral disorders: Secondary | ICD-10-CM | POA: Insufficient documentation

## 2012-07-13 DIAGNOSIS — F17200 Nicotine dependence, unspecified, uncomplicated: Secondary | ICD-10-CM | POA: Insufficient documentation

## 2012-07-13 DIAGNOSIS — Z79899 Other long term (current) drug therapy: Secondary | ICD-10-CM | POA: Insufficient documentation

## 2012-07-13 DIAGNOSIS — F3289 Other specified depressive episodes: Secondary | ICD-10-CM | POA: Insufficient documentation

## 2012-07-13 DIAGNOSIS — F411 Generalized anxiety disorder: Secondary | ICD-10-CM | POA: Insufficient documentation

## 2012-07-13 DIAGNOSIS — R45851 Suicidal ideations: Secondary | ICD-10-CM | POA: Insufficient documentation

## 2012-07-13 DIAGNOSIS — F1010 Alcohol abuse, uncomplicated: Secondary | ICD-10-CM | POA: Insufficient documentation

## 2012-07-13 DIAGNOSIS — Z8669 Personal history of other diseases of the nervous system and sense organs: Secondary | ICD-10-CM | POA: Insufficient documentation

## 2012-07-13 DIAGNOSIS — F329 Major depressive disorder, single episode, unspecified: Secondary | ICD-10-CM | POA: Insufficient documentation

## 2012-07-13 LAB — CBC
MCH: 30.5 pg (ref 26.0–34.0)
MCHC: 35.2 g/dL (ref 30.0–36.0)
Platelets: 185 10*3/uL (ref 150–400)
RDW: 12.9 % (ref 11.5–15.5)

## 2012-07-13 LAB — RAPID URINE DRUG SCREEN, HOSP PERFORMED
Amphetamines: NOT DETECTED
Benzodiazepines: NOT DETECTED
Tetrahydrocannabinol: NOT DETECTED

## 2012-07-13 LAB — COMPREHENSIVE METABOLIC PANEL
ALT: 61 U/L — ABNORMAL HIGH (ref 0–53)
AST: 33 U/L (ref 0–37)
Albumin: 3.8 g/dL (ref 3.5–5.2)
Calcium: 8.9 mg/dL (ref 8.4–10.5)
GFR calc Af Amer: 90 mL/min — ABNORMAL LOW (ref >90)
Potassium: 3.8 mEq/L (ref 3.5–5.1)
Sodium: 141 mEq/L (ref 135–145)
Total Protein: 6.5 g/dL (ref 6.0–8.3)

## 2012-07-13 LAB — ETHANOL: Alcohol, Ethyl (B): <11 mg/dL (ref 0–11)

## 2012-07-13 MED ORDER — LORAZEPAM 1 MG PO TABS
1.0000 mg | ORAL_TABLET | Freq: Three times a day (TID) | ORAL | Status: DC | PRN
  Administered 2012-07-13 – 2012-07-14 (×3): 1 mg via ORAL
  Filled 2012-07-13 (×4): qty 1

## 2012-07-13 MED ORDER — NICOTINE 21 MG/24HR TD PT24
21.0000 mg | MEDICATED_PATCH | Freq: Every day | TRANSDERMAL | Status: DC
  Administered 2012-07-13 – 2012-07-14 (×2): 21 mg via TRANSDERMAL
  Filled 2012-07-13 (×2): qty 1

## 2012-07-13 MED ORDER — ZOLPIDEM TARTRATE 5 MG PO TABS
5.0000 mg | ORAL_TABLET | Freq: Every evening | ORAL | Status: DC | PRN
  Administered 2012-07-13: 5 mg via ORAL
  Filled 2012-07-13: qty 1

## 2012-07-13 MED ORDER — ALUM & MAG HYDROXIDE-SIMETH 200-200-20 MG/5ML PO SUSP: 30.0000 mL | ORAL | Status: DC | PRN

## 2012-07-13 MED ORDER — ONDANSETRON HCL 8 MG PO TABS: 4.0000 mg | ORAL_TABLET | Freq: Three times a day (TID) | ORAL | Status: DC | PRN

## 2012-07-13 MED ORDER — QUETIAPINE FUMARATE 25 MG PO TABS
150.0000 mg | ORAL_TABLET | Freq: Three times a day (TID) | ORAL | Status: DC
  Administered 2012-07-13 – 2012-07-14 (×2): 150 mg via ORAL
  Filled 2012-07-13 (×2): qty 6

## 2012-07-13 MED ORDER — IBUPROFEN 400 MG PO TABS: 600.0000 mg | ORAL_TABLET | Freq: Three times a day (TID) | ORAL | Status: DC | PRN

## 2012-07-13 MED ORDER — FLUOXETINE HCL 20 MG PO TABS
80.0000 mg | ORAL_TABLET | Freq: Every day | ORAL | Status: DC
  Administered 2012-07-13 – 2012-07-14 (×2): 80 mg via ORAL
  Filled 2012-07-13 (×2): qty 4

## 2012-07-13 NOTE — BH Assessment (Signed)
Assessment Note   Joshua Dillon is an 27 y.o. male.  Pt came to [facility] because he was having thoughts of killing himself.  Patient states that he has thoughts of jumping from a bridge.  Hx of trying to kill self years ago by cutting throat.  Patient was living at [facility] and relapsed on ETOH on 03/28 and was discharged from there.  He cannot go back for 2 weeks.  Patient reports that he had a 40 oz malt liquor and that was the first in a year.  Patient denies any current HI or A/V hallucinations.  Patient cannot contract for safety at this time.  Patient also reports using marijuana several times per week.  Patient has been referred to [facility] and [place]. Axis I: Alcohol Abuse and Major Depression, single episode Axis II: Deferred Axis III:  Past Medical History  Diagnosis Date  . Depression   . ADHD (attention deficit hyperactivity disorder)   . Anxiety   . Alcoholism   . Seizure 03/2012    Secondary to tramadol.   Axis IV: economic problems, housing problems, occupational problems and problems with primary support group Axis V: 31-40 impairment in reality testing  Past Medical History:  Past Medical History  Diagnosis Date  . Depression   . ADHD (attention deficit hyperactivity disorder)   . Anxiety   . Alcoholism   . Seizure 03/2012    Secondary to tramadol.    History reviewed. No pertinent past surgical history.  Family History:  Family History  Problem Relation Age of Onset  . Drug abuse Mother     narcotics  . Alcohol abuse Paternal Grandmother   . Anxiety disorder Father   . Bipolar disorder Mother     Social History:  reports that he has been smoking Cigarettes.  He has a 2.4 pack-year smoking history. His smokeless tobacco use includes Snuff and Chew. He reports that he drinks about 16.5 ounces of alcohol per week. He reports that he uses illicit drugs (Marijuana and Benzodiazepines) about 7 times per week.  Additional Social History:  Alcohol / Drug  Use Pain Medications: See medication reconcilliation Prescriptions: Medication reconcilliation Over the Counter: See medication reconcilliation History of alcohol / drug use?: Yes Longest period of sobriety (when/how long): One year Negative Consequences of Use: Personal relationships Substance #1 Name of Substance 1: ETOH 1 - Age of First Use: 20's 1 - Amount (size/oz): Patient has been sober for a year.  He relapsed on 03/28 and drank 40 oz of malt liquor 1 - Frequency: One binge on 03/28 after a year sober 1 - Duration: On-going 1 - Last Use / Amount: 03/28  CIWA: CIWA-Ar BP: 155/93 mmHg Pulse Rate: 110 COWS:    Allergies: No Known Allergies  Home Medications:  (Not in a hospital admission)  OB/GYN Status:  No LMP for male patient.  General Assessment Data Location of Assessment: [facility] ED ACT Assessment: Yes Living Arrangements: Other (Comment) (Pt is homeless) Can pt return to current living arrangement?: Yes Admission Status: Voluntary Is patient capable of signing voluntary admission?: Yes Transfer from: Acute Hospital Referral Source: Self/Family/Friend     Risk to self Suicidal Ideation: Yes-Currently Present Suicidal Intent: Yes-Currently Present Is patient at risk for suicide?: Yes Suicidal Plan?: Yes-Currently Present Specify Current Suicidal Plan: Jump from a bridge Access to Means: Yes Specify Access to Suicidal Means: Bridges, traffic What has been your use of drugs/alcohol within the last 12 months?: Relapsed 24 hours ago Previous Attempts/Gestures: Yes  How many times?: 1 Other Self Harm Risks: None Triggers for Past Attempts: None known Intentional Self Injurious Behavior: None Family Suicide History: No Recent stressful life event(s): Turmoil (Comment) (D/C'ed from [facility] for drinking) Persecutory voices/beliefs?: No Depression: Yes Depression Symptoms: Guilt;Feeling worthless/self pity;Despondent Substance abuse history and/or treatment  for substance abuse?: Yes Suicide prevention information given to non-admitted patients: Not applicable  Risk to Others Homicidal Ideation: No Thoughts of Harm to Others: No Current Homicidal Intent: No Current Homicidal Plan: No Access to Homicidal Means: No Identified Victim: No one History of harm to others?: No Assessment of Violence: None Noted Violent Behavior Description: None reported Does patient have access to weapons?: No Criminal Charges Pending?: No Does patient have a court date: No  Psychosis Hallucinations: None noted Delusions: None noted  Mental Status Report Appear/Hygiene: Disheveled Eye Contact: Good Motor Activity: Freedom of movement;Unremarkable Speech: Logical/coherent Level of Consciousness: Alert Mood: Depressed;Anxious;Helpless Affect: Anxious;Sad Anxiety Level: Moderate Thought Processes: Coherent;Relevant Judgement: Unimpaired Orientation: Person;Place;Time;Situation Obsessive Compulsive Thoughts/Behaviors: None  Cognitive Functioning Concentration: Decreased Memory: Recent Intact;Remote Intact IQ: Average Insight: Fair Impulse Control: Fair Appetite: Good Weight Loss: 0 Weight Gain: 0 Sleep: Decreased Total Hours of Sleep: 5 Vegetative Symptoms: None  ADLScreening [facility] Assessment Services) Patient's cognitive ability adequate to safely complete daily activities?: Yes Patient able to express need for assistance with ADLs?: Yes Independently performs ADLs?: Yes (appropriate for developmental age)  Abuse/Neglect [facility]) Physical Abuse: Denies Verbal Abuse: Denies Sexual Abuse: Denies  Prior Inpatient Therapy Prior Inpatient Therapy: Yes Prior Therapy Dates: March 2013 Prior Therapy Facilty/Provider(s): [facility] Reason for Treatment: Detox  Prior Outpatient Therapy Prior Outpatient Therapy: Yes Prior Therapy Dates: Past several months Prior Therapy Facilty/Provider(s): [facility] Reason for Treatment: residential rehab  ADL  Screening (condition at time of admission) Patient's cognitive ability adequate to safely complete daily activities?: Yes Patient able to express need for assistance with ADLs?: Yes Independently performs ADLs?: Yes (appropriate for developmental age) Weakness of Legs: None Weakness of Arms/Hands: None  Home Assistive Devices/Equipment Home Assistive Devices/Equipment: None    Abuse/Neglect Assessment (Assessment to be complete while patient is alone) Physical Abuse: Denies Verbal Abuse: Denies Sexual Abuse: Denies Exploitation of patient/patient's resources: Denies Self-Neglect: Denies Values / Beliefs Cultural Requests During Hospitalization: None Spiritual Requests During Hospitalization: None        Additional Information 1:1 In Past 12 Months?: No CIRT Risk: No Elopement Risk: No Does patient have medical clearance?: Yes     Disposition:  Disposition Initial Assessment Completed for this Encounter: Yes Disposition of Patient: Inpatient treatment program;Referred to Type of inpatient treatment program: Adult Patient referred to:  (Referred to [facility])  On Site Evaluation by:   Reviewed with Physician:     Beatriz Stallion Ray 07/13/2012 10:10 PM

## 2012-07-13 NOTE — ED Notes (Signed)
Patient states that he has been at the homeless shelter.  Patient states that he is homeless and he is suicidal with the plan to take a bottle of tylenol.  Patient states that he started drinking a few days ago.  Patient states last night he drank 2 x 40 oz and a pint of beer and took pain pills with someone at the shelter.

## 2012-07-13 NOTE — ED Notes (Signed)
Security, [employer] coverage, and charge RN notified of patient.  AC advised that there is no sitter available until 1100a.m.   Charge RN advised.   Security on their way to wand patient.  All belongings taken from patient and placed in belongings bags.  Patient placed in paper scrubs.

## 2012-07-13 NOTE — ED Provider Notes (Signed)
History     CSN: [id number]  Arrival date & time 07/13/12  7829   First MD Initiated Contact with Patient 07/13/12 [phone]      Chief Complaint  Patient presents with  . Medical Clearance    (Consider location/radiation/quality/duration/timing/severity/associated sxs/prior treatment) HPI Comments: 27 year old male with a history of alcohol abuse, anxiety and depression presents to the emergency department requesting detox. He was seen in the emergency department yesterday and discharged with outpatient followup. He denied any suicidal or homicidal ideations at that time, however today does admit to suicidal ideations with a plan of taking an entire bottle of Tylenol. Last alcohol consumption was last night when he drank two 40 ounce beers along with a pint of beer. His friend gave him what he thought was a Percocet, however later found out it was a mirtazapine. Currently states he is feeling very anxious and does not want to go back out onto the street with fears of suicide. Admits to a history of withdrawal seizures, the last being in December.  The history is provided by the patient.    Past Medical History  Diagnosis Date  . Depression   . ADHD (attention deficit hyperactivity disorder)   . Anxiety   . Alcoholism   . Seizure 03/2012    Secondary to tramadol.    History reviewed. No pertinent past surgical history.  Family History  Problem Relation Age of Onset  . Drug abuse Mother     narcotics  . Alcohol abuse Paternal Grandmother   . Anxiety disorder Father   . Bipolar disorder Mother     History  Substance Use Topics  . Smoking status: Current Every Day Smoker -- 0.30 packs/day for 8 years    Types: Cigarettes  . Smokeless tobacco: Current User    Types: Snuff, Chew  . Alcohol Use: 16.5 oz/week    33 drink(s) per week     Comment: daily      Review of Systems  Psychiatric/Behavioral: Positive for suicidal ideas, behavioral problems and dysphoric mood. The  patient is nervous/anxious.   All other systems reviewed and are negative.    Allergies  Review of patient's allergies indicates no known allergies.  Home Medications   Current Outpatient Rx  Name  Route  Sig  Dispense  Refill  . acetaminophen (TYLENOL) 500 MG tablet   Oral   Take 500-1,000 mg by mouth every 6 (six) hours as needed for pain.         Marland Kitchen FLUoxetine (PROZAC) 40 MG capsule   Oral   Take 1 capsule (40 mg total) by mouth 2 (two) times daily.   60 capsule   0   . QUEtiapine (SEROQUEL) 50 MG tablet   Oral   Take 3 tablets (150 mg total) by mouth 3 (three) times daily.   270 tablet   0   . QUEtiapine (SEROQUEL) 50 MG tablet   Oral   Take 3 tablets (150 mg total) by mouth at bedtime.   90 tablet   0   . traZODone (DESYREL) 50 MG tablet   Oral   Take 50 mg by mouth at bedtime as needed for sleep.           BP 160/85  Pulse 116  Temp(Src) 98 F (36.7 C) (Oral)  Resp 22  Ht 6' (1.829 m)  Wt 215 lb (97.523 kg)  BMI 29.15 kg/m2  SpO2 97%  Physical Exam  Nursing note and vitals reviewed. Constitutional: He is oriented  to person, place, and time. He appears well-developed and well-nourished. No distress.  Fidgety  HENT:  Head: Normocephalic and atraumatic.  Mouth/Throat: Oropharynx is clear and moist.  Eyes: Conjunctivae and EOM are normal. Pupils are equal, round, and reactive to light.  Neck: Normal range of motion. Neck supple.  Cardiovascular: Regular rhythm and normal heart sounds.  Tachycardia present.   Pulmonary/Chest: Effort normal and breath sounds normal. No respiratory distress.  Abdominal: Soft. Bowel sounds are normal. He exhibits no distension and no mass. There is no tenderness.  Musculoskeletal: Normal range of motion. He exhibits no edema.  Neurological: He is alert and oriented to person, place, and time.  Skin: Skin is warm and dry. He is not diaphoretic.  Psychiatric: His speech is normal and behavior is normal. His mood appears  anxious. He exhibits a depressed mood. He expresses suicidal ideation. He expresses no homicidal ideation. He expresses suicidal plans.    ED Course  Procedures (including critical care time)  Labs Reviewed  CBC  COMPREHENSIVE METABOLIC PANEL  URINE RAPID DRUG SCREEN (HOSP PERFORMED)  ETHANOL  SALICYLATE LEVEL  ACETAMINOPHEN LEVEL   No results found.   1. Alcohol abuse   2. Suicidal ideation       MDM  27 y/o male requesting alcohol detox with SI. Spoke with [place] Sink with ACT team who will evaluate patient. He will be moved to pod C.        Trevor Mace, PA-C 07/13/12 1540

## 2012-07-14 ENCOUNTER — Encounter (HOSPITAL_COMMUNITY): Payer: Self-pay

## 2012-07-14 ENCOUNTER — Inpatient Hospital Stay (HOSPITAL_COMMUNITY)
Admission: AD | Admit: 2012-07-14 | Discharge: 2012-07-19 | DRG: 897 | Disposition: A | Discharge status: Home / Self Care | Payer: 59 | Source: Ambulatory Visit | Attending: Psychiatry | Admitting: Psychiatry

## 2012-07-14 DIAGNOSIS — F411 Generalized anxiety disorder: Secondary | ICD-10-CM | POA: Diagnosis present

## 2012-07-14 DIAGNOSIS — F909 Attention-deficit hyperactivity disorder, unspecified type: Secondary | ICD-10-CM | POA: Diagnosis present

## 2012-07-14 DIAGNOSIS — F1290 Cannabis use, unspecified, uncomplicated: Secondary | ICD-10-CM

## 2012-07-14 DIAGNOSIS — F419 Anxiety disorder, unspecified: Secondary | ICD-10-CM

## 2012-07-14 DIAGNOSIS — R45851 Suicidal ideations: Secondary | ICD-10-CM

## 2012-07-14 DIAGNOSIS — F39 Unspecified mood [affective] disorder: Secondary | ICD-10-CM | POA: Diagnosis present

## 2012-07-14 DIAGNOSIS — F329 Major depressive disorder, single episode, unspecified: Secondary | ICD-10-CM

## 2012-07-14 DIAGNOSIS — F17200 Nicotine dependence, unspecified, uncomplicated: Secondary | ICD-10-CM | POA: Diagnosis present

## 2012-07-14 DIAGNOSIS — F1010 Alcohol abuse, uncomplicated: Principal | ICD-10-CM | POA: Diagnosis present

## 2012-07-14 DIAGNOSIS — F1210 Cannabis abuse, uncomplicated: Secondary | ICD-10-CM | POA: Diagnosis present

## 2012-07-14 DIAGNOSIS — F1020 Alcohol dependence, uncomplicated: Secondary | ICD-10-CM

## 2012-07-14 DIAGNOSIS — Z79899 Other long term (current) drug therapy: Secondary | ICD-10-CM

## 2012-07-14 DIAGNOSIS — Z720 Tobacco use: Secondary | ICD-10-CM

## 2012-07-14 MED ORDER — MAGNESIUM HYDROXIDE 400 MG/5ML PO SUSP: 30.0000 mL | Freq: Every day | ORAL | Status: DC | PRN

## 2012-07-14 MED ORDER — QUETIAPINE FUMARATE 50 MG PO TABS
150.0000 mg | ORAL_TABLET | Freq: Three times a day (TID) | ORAL | Status: DC
  Administered 2012-07-14: 150 mg via ORAL
  Filled 2012-07-14 (×2): qty 1

## 2012-07-14 MED ORDER — ALUM & MAG HYDROXIDE-SIMETH 200-200-20 MG/5ML PO SUSP: 30.0000 mL | ORAL | Status: DC | PRN

## 2012-07-14 MED ORDER — ACETAMINOPHEN 325 MG PO TABS
650.0000 mg | ORAL_TABLET | Freq: Four times a day (QID) | ORAL | Status: DC | PRN
  Administered 2012-07-16: 650 mg via ORAL

## 2012-07-14 MED ORDER — QUETIAPINE FUMARATE 50 MG PO TABS
150.0000 mg | ORAL_TABLET | Freq: Every day | ORAL | Status: DC
  Administered 2012-07-14 – 2012-07-18 (×5): 150 mg via ORAL
  Filled 2012-07-14 (×7): qty 1

## 2012-07-14 MED ORDER — CARBAMAZEPINE 200 MG PO TABS
200.0000 mg | ORAL_TABLET | Freq: Three times a day (TID) | ORAL | Status: DC
  Administered 2012-07-14 – 2012-07-19 (×15): 200 mg via ORAL
  Filled 2012-07-14 (×8): qty 1
  Filled 2012-07-14: qty 42
  Filled 2012-07-14 (×2): qty 1
  Filled 2012-07-14 (×2): qty 42
  Filled 2012-07-14 (×5): qty 1
  Filled 2012-07-14: qty 42
  Filled 2012-07-14: qty 1
  Filled 2012-07-14: qty 42
  Filled 2012-07-14 (×4): qty 1
  Filled 2012-07-14: qty 42

## 2012-07-14 MED ORDER — QUETIAPINE FUMARATE 100 MG PO TABS
150.0000 mg | ORAL_TABLET | Freq: Three times a day (TID) | ORAL | Status: DC
  Administered 2012-07-14 – 2012-07-19 (×15): 150 mg via ORAL
  Filled 2012-07-14 (×25): qty 1

## 2012-07-14 MED ORDER — FLUOXETINE HCL 20 MG PO CAPS
80.0000 mg | ORAL_CAPSULE | Freq: Every day | ORAL | Status: DC
  Administered 2012-07-14 – 2012-07-18 (×5): 80 mg via ORAL
  Filled 2012-07-14 (×3): qty 4
  Filled 2012-07-14: qty 56
  Filled 2012-07-14: qty 1
  Filled 2012-07-14 (×2): qty 4
  Filled 2012-07-14: qty 1
  Filled 2012-07-14: qty 56
  Filled 2012-07-14 (×2): qty 4

## 2012-07-14 NOTE — Progress Notes (Signed)
Pt is a 27 year old male admitted with depression and ETOH dependence   He is suicidal with a plan to jump off of a bridge   He was staying at the [facility] but relapsed on ETOH and pot and was made to leave   He is currently homeless  He is requesting detox and long term treatment   He reports only drinking for a few days after being sober for months   He does not have any medical problems and no history for any surgeries   He is cooperative but very nervous and fidgety   Pt has been to [facility] before for detox    Orientation to the unit and admission complete   Nourishment offered   Verbal support given  Q 15 min checks   Pt safe at present

## 2012-07-14 NOTE — Progress Notes (Signed)
Pt has been in bed all evening resting. Did not attend group. Only up for snacks and meds. Forwards minimal information. Denies any withdrawal symptoms which would be expected with brief relapse. Medicated per orders. Support offered. He denies any SI/HI/AVH at this time and remains safe. Lawrence Marseilles

## 2012-07-14 NOTE — H&P (Signed)
Psychiatric Admission Assessment Adult  Patient Identification:  Joshua Dillon Date of Evaluation:  07/14/2012 Chief Complaint:  Major Depressive Disorder 296.23 Alcohol Abuse 305.00 History of Present Illness:: This is a voluntary admission for this 27 yo WM who presented to the [facility] yesterday reporting relapsing on beer and being suicidal with a plan to take an OD of Tylenol. He reports that he had been at an [place] house until he relapsed on Thursday drinking a 40% proof beverage called a Mad Dragon. He does not know how many he consumed.  He became suicidal when he realized he could not return to the [place] house and is now homeless.     He was given medical clearance and transferred to [facility] for further treatment and stabilization. Elements:  Location:  adult in patient admission. Quality:  chronic. Severity:  moderate. Timing:  on going. Duration:  worsening over the last 5 days. Context:  homelessness. Associated Signs/Synptoms: Depression Symptoms:  depressed mood, feelings of worthlessness/guilt, difficulty concentrating, hopelessness, suicidal thoughts with specific plan, (Hypo) Manic Symptoms:  none Anxiety Symptoms:  none Psychotic Symptoms:  none PTSD Symptoms: none Psychiatric Specialty Exam: Physical Exam completed in the ED. No exceptions noted.  Review of Systems  Constitutional: Negative.  Negative for fever, chills, weight loss, malaise/fatigue and diaphoresis.  HENT: Negative for congestion and sore throat.   Eyes: Negative for blurred vision, double vision and photophobia.  Respiratory: Negative for cough, shortness of breath and wheezing.   Cardiovascular: Negative for chest pain, palpitations and PND.  Gastrointestinal: Negative for heartburn, nausea, vomiting, abdominal pain, diarrhea and constipation.  Musculoskeletal: Negative for myalgias, joint pain and falls.  Neurological: Negative for dizziness, tingling, tremors, sensory change, speech change, focal  weakness, seizures, loss of consciousness, weakness and headaches.  Endo/Heme/Allergies: Negative for polydipsia. Does not bruise/bleed easily.  Psychiatric/Behavioral: Positive for depression, suicidal ideas and substance abuse. Negative for hallucinations and memory loss. The patient is not nervous/anxious and does not have insomnia.      Blood pressure 148/88, pulse 108, temperature 98.3 F (36.8 C), temperature source Oral, height 6' (1.829 m), weight 97.523 kg (215 lb).Body mass index is 29.15 kg/(m^2).  General Appearance: Casual  Eye Contact::  Fair  Speech:  Clear and Coherent  Volume:  Normal  Mood:  Depressed  Affect:  Congruent  Thought Process:  Linear  Orientation:  Full (Time, Place, and Person)  Thought Content:  Negative  Suicidal Thoughts:  Yes.  with intent/plan  Homicidal Thoughts:  No  Memory:  Immediate;   Fair  Judgement:  Impaired  Insight:  Present  Psychomotor Activity:  Restlessness  Concentration:  Fair  Recall:  Fair  Akathisia:  No  Handed:  Right  AIMS (if indicated):     Assets:  Communication Skills Physical Health Resilience  Sleep:   poor    Past Psychiatric History: Diagnosis: Depression major, recurrent severe with SI  Hospitalizations:8/2-13 [facility]  03/2012 [facility]  Outpatient Care: [facility]  Substance Abuse Care:  Self-Mutilation:  Suicidal Attempts:August 2013  Violent Behaviors:   Past Medical History:   Past Medical History  Diagnosis Date  . Depression   . ADHD (attention deficit hyperactivity disorder)   . Anxiety   . Alcoholism   . Seizure 03/2012    Secondary to tramadol.   Allergies:  No Known Allergies PTA Medications: Prescriptions prior to admission  Medication Sig Dispense Refill  . FLUoxetine (PROZAC) 40 MG capsule Take 80 mg by mouth daily.      Marland Kitchen  QUEtiapine (SEROQUEL) 50 MG tablet Take 3 tablets (150 mg total) by mouth 3 (three) times daily.  270 tablet  0  . QUEtiapine (SEROQUEL) 50 MG tablet Take 3  tablets (150 mg total) by mouth at bedtime.  90 tablet  0  . [DISCONTINUED] traZODone (DESYREL) 50 MG tablet Take 50 mg by mouth at bedtime as needed for sleep.        Previous Psychotropic Medications:  Medication/Dose                 Substance Abuse History in the last 12 months:  yes  Consequences of Substance Abuse: now homeless  Social History:  reports that he has been smoking Cigarettes.  He has a 2.4 pack-year smoking history. His smokeless tobacco use includes Snuff and Chew. He reports that he drinks about 16.5 ounces of alcohol per week. He reports that he uses illicit drugs (Marijuana and Benzodiazepines) about 7 times per week. Additional Social History: History of alcohol / drug use?: Yes Negative Consequences of Use: Financial;Personal relationships Current Place of Residence:  Homeless Place of Birth:   Family Members: Marital Status:  Single Children:  Sons:  Daughters: Relationships: Education:  Corporate treasurer Problems/Performance: Religious Beliefs/Practices: History of Abuse (Emotional/Phsycial/Sexual) Occupational Experiences; Hotel manager History:  Electronics engineer History: Hobbies/Interests:  Family History:   Family History  Problem Relation Age of Onset  . Drug abuse Mother     narcotics  . Alcohol abuse Paternal Grandmother   . Anxiety disorder Father   . Bipolar disorder Mother     Results for orders placed during the hospital encounter of 07/13/12 (from the past 72 hour(s))  CBC     Status: None   Collection Time    07/13/12  9:06 AM      Result Value Range   WBC 6.6  4.0 - 10.5 K/uL   RBC 4.63  4.22 - 5.81 MIL/uL   Hemoglobin 14.1  13.0 - 17.0 g/dL   HCT 62.9  52.8 - 41.3 %   MCV 86.6  78.0 - 100.0 fL   MCH 30.5  26.0 - 34.0 pg   MCHC 35.2  30.0 - 36.0 g/dL   RDW 24.4  01.0 - 27.2 %   Platelets 185  150 - 400 K/uL  COMPREHENSIVE METABOLIC PANEL     Status: Abnormal   Collection Time    07/13/12  9:06 AM      Result  Value Range   Sodium 141  135 - 145 mEq/L   Potassium 3.8  3.5 - 5.1 mEq/L   Chloride 105  96 - 112 mEq/L   CO2 26  19 - 32 mEq/L   Glucose, Bld 105 (*) 70 - 99 mg/dL   BUN 11  6 - 23 mg/dL   Creatinine, Ser 5.36  0.50 - 1.35 mg/dL   Calcium 8.9  8.4 - 64.4 mg/dL   Total Protein 6.5  6.0 - 8.3 g/dL   Albumin 3.8  3.5 - 5.2 g/dL   AST 33  0 - 37 U/L   ALT 61 (*) 0 - 53 U/L   Alkaline Phosphatase 70  39 - 117 U/L   Total Bilirubin 0.3  0.3 - 1.2 mg/dL   GFR calc non Af Amer 77 (*) >90 mL/min   GFR calc Af Amer 90 (*) >90 mL/min   Comment:            The eGFR has been calculated     using the CKD EPI  equation.     This calculation has not been     validated in all clinical     situations.     eGFR's persistently     <90 mL/min signify     possible Chronic Kidney Disease.  ETHANOL     Status: None   Collection Time    07/13/12  9:06 AM      Result Value Range   Alcohol, Ethyl (B) <11  0 - 11 mg/dL   Comment:            LOWEST DETECTABLE LIMIT FOR     SERUM ALCOHOL IS 11 mg/dL     FOR MEDICAL PURPOSES ONLY  SALICYLATE LEVEL     Status: Abnormal   Collection Time    07/13/12  9:06 AM      Result Value Range   Salicylate Lvl <2.0 (*) 2.8 - 20.0 mg/dL  ACETAMINOPHEN LEVEL     Status: None   Collection Time    07/13/12  9:06 AM      Result Value Range   Acetaminophen (Tylenol), Serum <15.0  10 - 30 ug/mL   Comment:            THERAPEUTIC CONCENTRATIONS VARY     SIGNIFICANTLY. A RANGE OF 10-30     ug/mL MAY BE AN EFFECTIVE     CONCENTRATION FOR MANY PATIENTS.     HOWEVER, SOME ARE BEST TREATED     AT CONCENTRATIONS OUTSIDE THIS     RANGE.     ACETAMINOPHEN CONCENTRATIONS     >150 ug/mL AT 4 HOURS AFTER     INGESTION AND >50 ug/mL AT 12     HOURS AFTER INGESTION ARE     OFTEN ASSOCIATED WITH TOXIC     REACTIONS.  URINE RAPID DRUG SCREEN (HOSP PERFORMED)     Status: None   Collection Time    07/13/12 10:06 AM      Result Value Range   Opiates NONE DETECTED  NONE  DETECTED   Cocaine NONE DETECTED  NONE DETECTED   Benzodiazepines NONE DETECTED  NONE DETECTED   Amphetamines NONE DETECTED  NONE DETECTED   Tetrahydrocannabinol NONE DETECTED  NONE DETECTED   Barbiturates NONE DETECTED  NONE DETECTED   Comment:            DRUG SCREEN FOR MEDICAL PURPOSES     ONLY.  IF CONFIRMATION IS NEEDED     FOR ANY PURPOSE, NOTIFY LAB     WITHIN 5 DAYS.                LOWEST DETECTABLE LIMITS     FOR URINE DRUG SCREEN     Drug Class       Cutoff (ng/mL)     Amphetamine      1000     Barbiturate      200     Benzodiazepine   200     Tricyclics       300     Opiates          300     Cocaine          300     THC              50   Psychological Evaluations:  Assessment:   AXIS I:  Substance induced mood disorder, alcohol dependence partial remission, recent relapse AXIS II:  Deferred AXIS III:   Past Medical History  Diagnosis Date  . Depression   .  ADHD (attention deficit hyperactivity disorder)   . Anxiety   . Alcoholism   . Seizure 03/2012    Secondary to tramadol.   AXIS IV:  housing problems AXIS V:  51-60 moderate symptoms  Treatment Plan/Recommendations:  1. Admit for crisis management and stabilization. 2. Medication management to reduce current symptoms to base line and improve the patient's overall level of functioning. 3. Treat health problems as indicated. 4. Develop treatment plan to decrease risk of relapse upon discharge and to reduce the need for readmission. 5. Psycho-social education regarding relapse prevention and self care. 6. Health care follow up as needed for medical problems. 7. Restart home medications where appropriate. Treatment Plan Summary: Daily contact with patient to assess and evaluate symptoms and progress in treatment Medication management Current Medications:  Current Facility-Administered Medications  Medication Dose Route Frequency Provider Last Rate Last Dose  . acetaminophen (TYLENOL) tablet 650 mg  650  mg Oral Q6H PRN Mike Craze, MD      . alum & mag hydroxide-simeth (MAALOX/MYLANTA) 200-200-20 MG/5ML suspension 30 mL  30 mL Oral Q4H PRN Mike Craze, MD      . carbamazepine (TEGRETOL) tablet 200 mg  200 mg Oral TID Mike Craze, MD   200 mg at 07/14/12 2000  . FLUoxetine (PROZAC) capsule 80 mg  80 mg Oral Daily Mike Craze, MD   80 mg at 07/14/12 1703  . magnesium hydroxide (MILK OF MAGNESIA) suspension 30 mL  30 mL Oral Daily PRN Mike Craze, MD      . QUEtiapine (SEROQUEL) tablet 150 mg  150 mg Oral QHS Mike Craze, MD      . QUEtiapine (SEROQUEL) tablet 150 mg  150 mg Oral TID Himabindu Ravi, MD   150 mg at 07/14/12 1703    Observation Level/Precautions:  routine  Laboratory:  ALT elevated, BAL <11, UDS-negative, CMP-GFR decreased  Psychotherapy:  Group and individual  Medications:  Seroquel, Prozac, add Tegratol 200mg  po TID  Consultations:    Discharge Concerns:  Increased risk for relapse, consider residential rehab   Estimated LOS: 2-5 days  Other:      I certify that inpatient services furnished can reasonably be expected to improve the patient's condition.   Rona Ravens. Lyndsee Casa RPAC 9:54 PM 07/14/2012

## 2012-07-14 NOTE — Progress Notes (Signed)
Patient has been accepted at [facility] by Dr. Dan Humphreys pending bed availability.

## 2012-07-14 NOTE — BHH Counselor (Signed)
Pt admission documents have been signed and faxed to [facility]. Pt informed writer at the time of signing his documents that he has VA benefits and he wants to enter long term tx, after completing detox. Denice Bors, AADC 07/14/2012 9:35 AM

## 2012-07-14 NOTE — ED Provider Notes (Signed)
[facility] been accepted at [facility] awaiting for a bed to become available  Toy Baker, MD 07/14/12 0830

## 2012-07-14 NOTE — Clinical Social Work Note (Signed)
BHH Group Notes: (Clinical Social Work)   07/14/2012      Type of Therapy:  Group Therapy   Participation Level:  Did Not Attend    Ambrose Mantle, LCSW 07/14/2012, 4:47 PM

## 2012-07-14 NOTE — Tx Team (Signed)
Initial Interdisciplinary Treatment Plan  PATIENT STRENGTHS: (choose at least two) Capable of independent living General fund of knowledge  PATIENT STRESSORS: Financial difficulties Medication change or noncompliance Substance abuse   PROBLEM LIST: Problem List/Patient Goals Date to be addressed Date deferred Reason deferred Estimated date of resolution  ETOH dependence                                                       DISCHARGE CRITERIA:  Adequate post-discharge living arrangements Improved stabilization in mood, thinking, and/or behavior Withdrawal symptoms are absent or subacute and managed without 24-hour nursing intervention  PRELIMINARY DISCHARGE PLAN: Attend aftercare/continuing care group Attend 12-step recovery group  PATIENT/FAMIILY INVOLVEMENT: This treatment plan has been presented to and reviewed with the patient, Joshua Dillon, and/or family member, .  The patient and family have been given the opportunity to ask questions and make suggestions.  Andrena Mews 07/14/2012, 12:20 PM

## 2012-07-14 NOTE — ED Provider Notes (Signed)
Medical screening examination/treatment/procedure(s) were performed by non-physician practitioner and as supervising physician I was immediately available for consultation/collaboration.    Rithik Odea D Yarelie Hams, MD 07/14/12 1634 

## 2012-07-14 NOTE — BHH Counselor (Signed)
Pt assigned to [facility], Dr. Dan Humphreys to Dr. Daleen Bo. Denice Bors, AADC 07/14/2012 9:02 AM

## 2012-07-14 NOTE — BHH Suicide Risk Assessment (Signed)
Suicide Risk Assessment  Admission Assessment     Information obtained from:   Demographic factors:   Current Mental Status per Physician Patient denies suicidal or homicidal ideation, hallucinations, illusions, or delusions. Patient engages with good eye contact, is able to focus adequately in a one to one setting, and has clear goal directed thoughts. Patient speaks with a natural conversational volume, rate, and tone. Anxiety was reported at 7 on a scale of 1 the least and 10 the most. Depression was reported at 9 on the same scale. Patient is oriented times 4, recent and remote memory intact. Judgement: limited by his mental illness and his addictive thinking. Insight: limited by his mental illness and his addictive thinking.  Loss Factors:   Historical Factors:   Risk Reduction Factors:    CLINICAL FACTORS:   Depression:   Comorbid alcohol abuse/dependence Hopelessness Alcohol/Substance Abuse/Dependencies More than one psychiatric diagnosis  COGNITIVE FEATURES THAT CONTRIBUTE TO RISK:  Closed-mindedness Loss of executive function Polarized thinking Thought constriction (tunnel vision)    SUICIDE RISK:   Moderate:  Frequent suicidal ideation with limited intensity, and duration, some specificity in terms of plans, no associated intent, good self-control, limited dysphoria/symptomatology, some risk factors present, and identifiable protective factors, including available and accessible social support.  PLAN OF CARE: 1 Admit for crisis management and stabilization.  Estimate length of stay is 3 to 5 days.  2. Medication management to reduce current symptoms to base line and improve the patient's overall level of functioning.  3. Treat health problems as indicated:  4. Develop treatment plan to decrease risk of relapse upon discharge and the need for readmission.  5. Psycho-social education regarding relapse prevention and self-care.  6. Review and reinstate any  pertinent home medication for other health issues where appropriate.  7. Call for Consult with [facility] for additional specialty patient services as needed.  I certify that inpatient services furnished can reasonably be expected to improve the patient's condition.  Orson Aloe, MD, [facility] 07/14/2012 1:55 PM

## 2012-07-14 NOTE — Progress Notes (Addendum)
Joshua Dillon is seen ...OOb UAL on the unit, tolerated fair. HE is bright . He can be intrusive at times but responds positiveoly when he is redirected by staff. He takes his meds as ordered, he interacts in the milieu and is seen laughing and joking with other pts in the dayroom.   A He denies SI .   R Safety is in place and POC cont.

## 2012-07-15 ENCOUNTER — Encounter (HOSPITAL_COMMUNITY): Payer: Self-pay | Admitting: Registered Nurse

## 2012-07-15 DIAGNOSIS — F909 Attention-deficit hyperactivity disorder, unspecified type: Secondary | ICD-10-CM

## 2012-07-15 NOTE — Progress Notes (Signed)
Adult Psychoeducational Group Note  Date:  07/15/2012 Time:  6:35 PM  Group Topic/Focus:  Self Care:   The focus of this group is to help patients understand the importance of self-care in order to improve or restore emotional, physical, spiritual, interpersonal, and financial health.  Participation Level:  Minimal  Participation Quality:  Appropriate  Affect:  Flat  Cognitive:  Appropriate  Insight: Appropriate  Engagement in Group:  Limited  Modes of Intervention:  Discussion, Education and Support  Additional Comments:  Pt reported late to group. Pt did not complete self-care assessment. During final stage of group, after being called upon by staff to share, pt shared that he wanted to improve his physical self-care by "getting sober." Pt shared that he stayed clean for 1 year prior to his recent relapse. Staff encouraged pt, remarking that he can "pick up the pieces."  Elita Quick 07/15/2012, 6:35 PM
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
## 2012-07-15 NOTE — Progress Notes (Signed)
[facility] MD Progress Note  07/15/2012 1:39 PM Joshua Dillon  MRN:  [id number] Subjective:  "I mainly here cause I got a alcohol problem; I love doing pain pills and pot but that is not what got be here.  I need to get into a 30 day program; I had went a year and I guess I just wanted to see what it was to feel like that again; I don't know"  Diagnosis:  Axis I: ADHD, hyperactive type, Substance Abuse and Alcohol Dependance  ADL's:  Intact  Sleep: Good  Appetite:  Good  Suicidal Ideation: yes Plan:  yes Intent:  yes Means:  yes "I said if I had to live on the street I would jump off the overpass or what ever I needed to do to not live like that; I feel like I got to much to offer to live like a hobo." Homicidal Ideation:  Denies AEB (as evidenced by):  Patient states that he is participating in group half way.  "I've only been here since yesterday, I am getting stuff from them. "  Psychiatric Specialty Exam: Review of Systems  Psychiatric/Behavioral: Positive for depression (Rates 2-3/10), suicidal ideas and substance abuse. Negative for hallucinations and memory loss. The patient is nervous/anxious (Rates 5-6/10 stressor "situational; like where I'm going to live and relapsing). The patient does not have insomnia.     Blood pressure 126/81, pulse 96, temperature 97.5 F (36.4 C), temperature source Oral, resp. rate 18, height 6' (1.829 m), weight 97.523 kg (215 lb).Body mass index is 29.15 kg/(m^2).  General Appearance: Bizarre and Disheveled  Eye Contact::  Fair  Speech:  Clear and Coherent and Normal Rate  Volume:  Increased  Mood:  Anxious, Depressed and Hopeless  Affect:  Depressed and Flat  Thought Process:  Circumstantial and Loose  Orientation:  Full (Time, Place, and Person)  Thought Content:  WDL  Suicidal Thoughts:  Yes.  with intent/plan  Homicidal Thoughts:  No  Memory:  Immediate;   Good Recent;   Good Remote;   Good  Judgement:  Fair  Insight:  Fair   Psychomotor Activity:  Normal  Concentration:  Fair  Recall:  Good  Akathisia:  No  Handed:  Right  AIMS (if indicated):     Assets:  Communication Skills Physical Health  Sleep:  Number of Hours: 6.5   Current Medications: Current Facility-Administered Medications  Medication Dose Route Frequency Provider Last Rate Last Dose  . acetaminophen (TYLENOL) tablet 650 mg  650 mg Oral Q6H PRN Mike Craze, MD      . alum & mag hydroxide-simeth (MAALOX/MYLANTA) 200-200-20 MG/5ML suspension 30 mL  30 mL Oral Q4H PRN Mike Craze, MD      . carbamazepine (TEGRETOL) tablet 200 mg  200 mg Oral TID Mike Craze, MD   200 mg at 07/15/12 1306  . FLUoxetine (PROZAC) capsule 80 mg  80 mg Oral Daily Mike Craze, MD   80 mg at 07/14/12 1703  . magnesium hydroxide (MILK OF MAGNESIA) suspension 30 mL  30 mL Oral Daily PRN Mike Craze, MD      . QUEtiapine (SEROQUEL) tablet 150 mg  150 mg Oral QHS Mike Craze, MD   150 mg at 07/14/12 2116  . QUEtiapine (SEROQUEL) tablet 150 mg  150 mg Oral TID Himabindu Ravi, MD   150 mg at 07/15/12 1145    Lab Results: No results found for this or any previous visit (from the past 48  hour(s)).  Physical Findings: AIMS: Facial and Oral Movements Muscles of Facial Expression: None, normal Lips and Perioral Area: None, normal Jaw: None, normal Tongue: None, normal,Extremity Movements Upper (arms, wrists, hands, fingers): None, normal Lower (legs, knees, ankles, toes): None, normal, Trunk Movements Neck, shoulders, hips: None, normal, Overall Severity Severity of abnormal movements (highest score from questions above): None, normal Incapacitation due to abnormal movements: None, normal Patient's awareness of abnormal movements (rate only patient's report): No Awareness, Dental Status Current problems with teeth and/or dentures?: No Does patient usually wear dentures?: No  CIWA:  CIWA-Ar Total: 4 COWS:     Treatment Plan Summary: Daily contact with  patient to assess and evaluate symptoms and progress in treatment Medication management  Plan:  Will continue current plan and treatment.   Medical Decision Making Problem Points:  Established problem, stable/improving (1), Review of last therapy session (1) and Review of psycho-social stressors (1) Data Points:  Independent review of image, tracing, or specimen (2) Review or order clinical lab tests (1) Review of medication regiment & side effects (2)  I certify that inpatient services furnished can reasonably be expected to improve the patient's condition.   Shuvon B. Rankin FNP-BC Family Nurse Practitioner, Board Certified   Rankin, Shuvon 07/15/2012, 1:39 PM

## 2012-07-15 NOTE — Progress Notes (Signed)
[facility] LCSW Aftercare Discharge Planning Group Note  07/15/2012 12:54 PM  Participation Quality:  Appropriate  Affect:  Appropriate  Cognitive:  Appropriate  Insight:  Engaged  Engagement in Group:  Engaged  Modes of Intervention:  Education, Exploration, Public house manager of Progress/Problems:  Patient reports admitting to hospital due to relapsing on alcohol.  He denies SI/HI and rates depression at two/three and anxiety at zero.  Wynn Banker 07/15/2012, 12:54 PM

## 2012-07-15 NOTE — Progress Notes (Signed)
[facility] LCSW Group Therapy  07/15/2012 2:56 PM  Type of Therapy:  Group Therapy  Participation Level:  Active  Participation Quality:  Appropriate and Attentive  Affect:  Appropriate  Cognitive:  Appropriate  Insight:  Engaged  Engagement in Therapy:  Engaged  Modes of Intervention:  Discussion, Education, Exploration, Problem-solving, Rapport Building and Support  Summary of Progress/Problems:  Patient shared the obstacle he needs to overcome is transportation.  He shared he receives services from the [place] in [place] but often has no way of getting to appointments.  Patient was advised [facility] with [facility] and also with [facility] for bus service.  Wynn Banker 07/15/2012, 2:56 PM

## 2012-07-15 NOTE — Progress Notes (Signed)
Pt denies SI/HI/AVH. Pt endorse feeling depressed today. Pt reported that he is concerned about where he will go once he is discharged. Pt reported that he sleeps a lot in order to not deal with reality. 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 Pt has minimal interaction on the unit.  Pt is compliant with meds.  Medications administered as ordered per MD. Verbal support given. Pt encouraged to attend groups. 15 minute checks performed for safety.  Pt is depressed. Pt concerned about discharge aftercare treatment. Pt reported that he will contact his mother today for support or advice about aftercare treatment.

## 2012-07-15 NOTE — Progress Notes (Signed)
D: Patient in the dayroom on approach.  Patient state she had a good day.  Patient states, I had three meal and a place to sleep so that is good."  Patient states he is concerned because he was supposed to have an appointment at [facility] today but because he was here he was unable to make it.  Patient states, "I want to get my head straightened out."  Patient states he needs help finding a place to go when discharged.  Patient denies SI/HI and denies AVH.  A: Staff to monitor Q 15 mins for safety.  Encouragement and support offered.  Scheduled medications administered per orders.   R: Patient remains safe on the unit.  Patient calm, cooperative and taking administered medications per orders.  Patient visible on the unit and interacting with peers.

## 2012-07-15 NOTE — Clinical Social Work Note (Addendum)
Joshua Dillon goes by Joshua Dillon."  He was not in AM group.  Found him in bed, awake, engageable.  He is a man with a plan.  He left [facility] a bit ago after being there for 2 mos.  He left when he found out he had become [facility] connected.  Wishes he had stayed, but does not want to go back.  Was also at [facility], and then in an [address], where he relapsed and was kicked out.  He wants to get into [employer] [facility].  We called, and he has to have 14 days sobriety to get in there.  Joshua Dillon gave [place] information about the [facility] progam in [place], and also a contact name and #.  I will follow up.  Found out the [place] can do an assessment, but getting directly into rehab is not an option.  Reported back to patient.  He adamantly stated that going to the streets is not an option.  Would not say why.  Told him about [facility] and [facility].  He is open to either.

## 2012-07-16 MED ORDER — ONDANSETRON 4 MG PO TBDP
4.0000 mg | ORAL_TABLET | Freq: Three times a day (TID) | ORAL | Status: DC | PRN
  Administered 2012-07-16: 4 mg via ORAL

## 2012-07-16 MED ORDER — ONDANSETRON 4 MG PO TBDP
4.0000 mg | ORAL_TABLET | Freq: Once | ORAL | Status: AC
  Administered 2012-07-16: 4 mg via ORAL

## 2012-07-16 MED ORDER — ONDANSETRON 4 MG PO TBDP
8.0000 mg | ORAL_TABLET | Freq: Three times a day (TID) | ORAL | Status: DC | PRN
  Administered 2012-07-16: 8 mg via ORAL

## 2012-07-16 NOTE — Progress Notes (Signed)
Patient ID: Joshua Dillon, male   DOB: 1985/10/16, 27 y.o.   MRN: [id number] [facility] MD Progress Note  07/16/2012 5:02 PM Amato Sevillano  MRN:  [id number] Subjective:  Yesterday, last night and this morning, was pretty explosive, but I feel much better now. I haven't left the room all day.  Wondering where I'm going to go.  Objective: Berle is reporting he had symptoms of Norovirus as noted above but feels better today. States he has no w/d symptoms at this time, just feels "washed out." Diagnosis:  Axis I: ADHD, hyperactive type, Substance Abuse and Alcohol Dependance  ADL's:  Intact  Sleep: Good  Appetite:  poor  Suicidal Ideation:  Denies Homicidal Ideation:  Denies AEB (as evidenced by):  Patient states that he is participating in group half way.  "I've only been here since yesterday, I am getting stuff from them. "  Psychiatric Specialty Exam: Review of Systems  Psychiatric/Behavioral: Positive for depression (Rates 2-3/10), suicidal ideas and substance abuse. Negative for hallucinations and memory loss. The patient is nervous/anxious (Rates 5-6/10 stressor "situational; like where I'm going to live and relapsing). The patient does not have insomnia.     Blood pressure 146/87, pulse 134, temperature 98.6 F (37 C), temperature source Oral, resp. rate 20, height 6' (1.829 m), weight 97.523 kg (215 lb).Body mass index is 29.15 kg/(m^2).  General Appearance: disheveled  Patent attorney::  Fair  Speech:  Clear and Coherent and Normal Rate  Volume:  Increased  Mood:  fatigued  Affect:  Depressed and Flat  Thought Process:  linear  Orientation:  Full (Time, Place, and Person)  Thought Content:  WDL  Suicidal Thoughts:  Denies today  Homicidal Thoughts:  No  Memory:  Immediate;   Good Recent;   Good Remote;   Good  Judgement:  Fair  Insight:  Fair  Psychomotor Activity:  Normal  Concentration:  Fair  Recall:  Good  Akathisia:  No  Handed:  Right  AIMS (if indicated):      Assets:  Communication Skills Physical Health  Sleep:  Number of Hours: 6.75   Current Medications: Current Facility-Administered Medications  Medication Dose Route Frequency Provider Last Rate Last Dose  . acetaminophen (TYLENOL) tablet 650 mg  650 mg Oral Q6H PRN Mike Craze, MD   650 mg at 07/16/12 1430  . alum & mag hydroxide-simeth (MAALOX/MYLANTA) 200-200-20 MG/5ML suspension 30 mL  30 mL Oral Q4H PRN Mike Craze, MD      . carbamazepine (TEGRETOL) tablet 200 mg  200 mg Oral TID Mike Craze, MD   200 mg at 07/16/12 1159  . FLUoxetine (PROZAC) capsule 80 mg  80 mg Oral Daily Mike Craze, MD   80 mg at 07/15/12 1709  . magnesium hydroxide (MILK OF MAGNESIA) suspension 30 mL  30 mL Oral Daily PRN Mike Craze, MD      . ondansetron (ZOFRAN-ODT) disintegrating tablet 8 mg  8 mg Oral Q8H PRN Karolee Stamps, NP   8 mg at 07/16/12 1434  . QUEtiapine (SEROQUEL) tablet 150 mg  150 mg Oral QHS Mike Craze, MD   150 mg at 07/15/12 2106  . QUEtiapine (SEROQUEL) tablet 150 mg  150 mg Oral TID Himabindu Ravi, MD   150 mg at 07/16/12 1252    Lab Results: No results found for this or any previous visit (from the past 48 hour(s)).  Physical Findings: C/W NOROVIRUS AIMS: Facial and Oral Movements Muscles of Facial Expression: None,  normal Lips and Perioral Area: None, normal Jaw: None, normal Tongue: None, normal,Extremity Movements Upper (arms, wrists, hands, fingers): None, normal Lower (legs, knees, ankles, toes): None, normal, Trunk Movements Neck, shoulders, hips: None, normal, Overall Severity Severity of abnormal movements (highest score from questions above): None, normal Incapacitation due to abnormal movements: None, normal Patient's awareness of abnormal movements (rate only patient's report): No Awareness, Dental Status Current problems with teeth and/or dentures?: No Does patient usually wear dentures?: No  CIWA:  CIWA-Ar Total: 4 COWS:     Treatment Plan  Summary: Daily contact with patient to assess and evaluate symptoms and progress in treatment Medication management  Plan:  1. Continue crisis management and stabilization. 2. Medication management to reduce current symptoms to base line and improve patient's overall level of functioning 3. Treat health problems as indicated. 4. Develop treatment plan to decrease risk of relapse upon discharge and the need for     readmission. 5. Psycho-social education regarding relapse prevention and self care. 6. Health care follow up as needed for medical problems. 7. Continue home medications where appropriate. 8. Zofran for GI sx. As ordered. 9. Patient encouraged to work with CM regarding rehab on discharge.   Medical Decision Making Problem Points:  Established problem, stable/improving (1), Review of last therapy session (1) and Review of psycho-social stressors (1) Data Points:  Independent review of image, tracing, or specimen (2) Review or order clinical lab tests (1) Review of medication regiment & side effects (2)  I certify that inpatient services furnished can reasonably be expected to improve the patient's condition.  Rona Ravens. Sharica Roedel RPAC 5:06 PM 07/16/2012

## 2012-07-16 NOTE — Progress Notes (Signed)
Patient ID: Joshua Dillon, male   DOB: [birth year], 27 y.o.   MRN: [id number] D Patient has been in bed all day,  He is on contact precautions for suspected virus.  He had vomited one time and had diarrhea one time prior to 0700 this am.  Sine then he has felt nauseated but has not vomited or had a bowel movement.  He presently has a headache.  A-Supported patient.  R-Resting in bed and sleeping off and on.

## 2012-07-16 NOTE — Progress Notes (Signed)
Patient states he did not feel well this morning.  Patient was given ginger ale and then went to his room.  Patient then was heard from the hallway vomiting in the bathroom.  Patient vomited a moderate amount.  Donell Sievert PA notified and new orders received.  Zofran was administered prn for nausea and vomiting.

## 2012-07-16 NOTE — Progress Notes (Signed)
D: Patient in his room in bed on approach.  Patient state she is feeling better than he felt this AM.  Patient remains on contact precautions for suspected norovirus.  PAtient states he was vomiting and having diarrhea this am but has not had none this afternoon or tonight.  Patient has been eating, taking in fluids and he is able to keep them down.  Patient states he has been in bed resting most of the day.  Patient states he is trying to get better.  Patient denies SI/HI and denies AVH. A: Staff to monitor Q 15 mins for safety.  Encouragement and support offered.  Medications administered per orders. R: Patient remains safe on the unit.  Patient calm, cooperative and taking administered medications.

## 2012-07-16 NOTE — H&P (Signed)
I have examined there patient and agree with the findings. I certify that inpatient services furnished can reasonably be expected to improve the patient's condition. Heather Streeper, MD, MSPH  

## 2012-07-17 DIAGNOSIS — F1910 Other psychoactive substance abuse, uncomplicated: Secondary | ICD-10-CM

## 2012-07-17 DIAGNOSIS — F411 Generalized anxiety disorder: Secondary | ICD-10-CM

## 2012-07-17 DIAGNOSIS — F1994 Other psychoactive substance use, unspecified with psychoactive substance-induced mood disorder: Secondary | ICD-10-CM

## 2012-07-17 MED ORDER — CHLORDIAZEPOXIDE HCL 25 MG PO CAPS
25.0000 mg | ORAL_CAPSULE | Freq: Four times a day (QID) | ORAL | Status: DC | PRN
  Administered 2012-07-17: 25 mg via ORAL
  Filled 2012-07-17: qty 1
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
## 2012-07-17 NOTE — Progress Notes (Addendum)
D: Patient appropriate and cooperative with staff and peers. Patient's affect/mood is blunted and depressed. He reported on the self inventory sheet that his sleep is fair, appetite is good, energy level is low and ability to pay attention is poor. Patient rated depression "4" and feelings of hopelessness "7".  A: Support and encouragement provided to patient. Administered scheduled medications per ordering MD. Maintain contact precautions and monitor Q15 minute checks for safety.  R: Patient receptive. Passive SI, but contracts for safety. Denies HI and AVH. Patient remains safe on the unit.

## 2012-07-17 NOTE — Clinical Social Work Note (Signed)
Met with patient this AM to discuss dispositional plan.  Let him know that [facility] has no openings, [facility] is a long shot, and that he has an appointment at [facility] for the 17th.  He indicated he will attempt to make some calls later today to try to secure a place to go until he can get into [facility].

## 2012-07-18 DIAGNOSIS — F1010 Alcohol abuse, uncomplicated: Principal | ICD-10-CM

## 2012-07-18 NOTE — BHH Counselor (Signed)
Adult Comprehensive Assessment  Patient ID: Joshua Dillon, male   DOB: [birth year], 27 y.o.   MRN: [id number]  Information Source: Information source: Patient  Current Stressors:  Educational / Learning stressors: N/A Employment / Job issues: Yes  Not working Family Relationships: Yes  Estrangement due to substance abuse Surveyor, quantity / Lack of resources (include bankruptcy): Yes  No income Housing / Lack of housing: Yes  Homeless Physical health (include injuries & life threatening diseases): N/A Social relationships: Yes  Few Substance abuse: Yes Bereavement / Loss: N/A  Living/Environment/Situation:  Living Arrangements:  (homeless) How long has patient lived in current situation?: Was in an [address], but kicked out for drinking.  Prior to that in [place]; prior to that in [place]  Family History:  Marital status: Single  Childhood History:  By whom was/is the patient raised?:  (Declined to answer the rest of the questions.) Additional childhood history information:  (when he discovered I could not get him directly into rehab.)  Education:     Employment/Work Situation:      Surveyor, quantity Resources:      Alcohol/Substance Abuse:      Social Support System:      Leisure/Recreation:      Strengths/Needs:      Discharge Plan:      Summary/Recommendations:   Emergency planning/management officer and Recommendations (to be completed by the evaluator): "Joshua Dillon" is a 27 yo caucasian male who struggles with alcohol dependence.  He has been in several treatment settings, but has made poor decisions along the way, estranging him from the family and rendering him homeless with no current income.  He can benefit from crises stabilization, medication management, therapeutic milieu and  referral for services.  Daryel Gerald B. 07/18/2012

## 2012-07-18 NOTE — Progress Notes (Signed)
Patient ID: Joshua Dillon, male   DOB: 1986-01-27, 27 y.o.   MRN: [id number] D: Pt. Lying in bed reading, reports "doing fine". Pt. Reports no symptoms of diarrhea since yesterday morning. "I'M ready to get off this contact." A: Writer introduced self to client. Discuss with client the importance of being isolated to prevent further spread of virus. Writer let pt. Know physician will notify when isolation is over. Staff will monitor q70min for safety. Writer offered food and drink. R: Pt. In compliance with isolation. Pt. Is safe on the unit.

## 2012-07-18 NOTE — Clinical Social Work Note (Signed)
Today pt indicates his plan is to get to [facility] to try to get into the [place] there as "I am not going to any shelter."  Train does not run to [place]; bus does but takes 13 hours.  He was told by Dr this AM that he will be d/ced tomorrow.  He is calling friends/family to see if he can get a ride to [place].

## 2012-07-18 NOTE — Progress Notes (Signed)
D: Patient appropriate and cooperative with staff. Patient's affect/mood is blunted and depressed, but anxious at times as the day progresses. Patient verbalized that he is to be discharged from the hospital on tomorrow. He reported on the self inventory sheet that his sleep is fair, appetite is good, energy level is hyper and ability to pay attention is poor. Patient rated depression "5" and feelings of hopelessness "6".  A: Support and encouragement provided to patient. Scheduled medications administered per MD orders. Maintain Q15 minute checks for safety. Per MD orders, contact precautions have been d/c.  R: Patient receptive. Denies SI/HI/AVH. Patient remains safe on the unit.

## 2012-07-18 NOTE — Progress Notes (Signed)
[name] MD Progress Note  07/18/2012 10:49 AM Joshua Dillon  MRN:  [id number] Subjective:  Patient reports he is feeling down, interested in going to a residential rehab. Symptoms of stomach virus have subsided, patient able to eat and sleep. Tolerating his medications well, denies any side effects.  Diagnosis:   Axis I: Alcohol Abuse and Substance Induced Mood Disorder Axis II: Deferred Axis III:  Past Medical History  Diagnosis Date  . Depression   . ADHD (attention deficit hyperactivity disorder)   . Anxiety   . Alcoholism   . Seizure 03/2012    Secondary to tramadol.   Axis IV: other psychosocial or environmental problems Axis V: 41-50 serious symptoms  ADL's:  Intact  Sleep: Fair  Appetite:  Fair    Psychiatric Specialty Exam: Review of Systems  Constitutional: Positive for malaise/fatigue.  HENT: Negative.   Eyes: Negative.   Respiratory: Negative.   Cardiovascular: Negative.   Gastrointestinal: Negative.   Genitourinary: Negative.   Musculoskeletal: Negative.   Skin: Negative.   Neurological: Negative.   Endo/Heme/Allergies: Negative.   Psychiatric/Behavioral: Positive for depression and substance abuse. The patient is nervous/anxious.     Blood pressure 139/82, pulse 99, temperature 98.3 F (36.8 C), temperature source Oral, resp. rate 20, height 6' (1.829 m), weight 97.523 kg (215 lb).Body mass index is 29.15 kg/(m^2).  General Appearance: Casual  Eye Contact::  Fair  Speech:  Normal Rate  Volume:  Decreased  Mood:  Depressed  Affect:  Appropriate  Thought Process:  Coherent  Orientation:  Full (Time, Place, and Person)  Thought Content:  Rumination  Suicidal Thoughts:  Yes.  without intent/plan  Homicidal Thoughts:  No  Memory:  Immediate;   Fair Recent;   Fair Remote;   Fair  Judgement:  Fair  Insight:  Fair  Psychomotor Activity:  Normal  Concentration:  Fair  Recall:  Fair  Akathisia:  No  Handed:  Right  AIMS (if indicated):     Assets:   Communication Skills Desire for Improvement  Sleep:  Number of Hours: 6.5   Current Medications: Current Facility-Administered Medications  Medication Dose Route Frequency Provider Last Rate Last Dose  . acetaminophen (TYLENOL) tablet 650 mg  650 mg Oral Q6H PRN Mike Craze, MD   650 mg at 07/16/12 1430  . alum & mag hydroxide-simeth (MAALOX/MYLANTA) 200-200-20 MG/5ML suspension 30 mL  30 mL Oral Q4H PRN Mike Craze, MD      . carbamazepine (TEGRETOL) tablet 200 mg  200 mg Oral TID Mike Craze, MD   200 mg at 07/18/12 7829  . chlordiazePOXIDE (LIBRIUM) capsule 25 mg  25 mg Oral QID PRN Kerry Hough, PA-C   25 mg at 07/17/12 2222  . FLUoxetine (PROZAC) capsule 80 mg  80 mg Oral Daily Mike Craze, MD   80 mg at 07/17/12 1649  . magnesium hydroxide (MILK OF MAGNESIA) suspension 30 mL  30 mL Oral Daily PRN Mike Craze, MD      . ondansetron (ZOFRAN-ODT) disintegrating tablet 8 mg  8 mg Oral Q8H PRN Karolee Stamps, NP   8 mg at 07/16/12 1434  . QUEtiapine (SEROQUEL) tablet 150 mg  150 mg Oral QHS Mike Craze, MD   150 mg at 07/17/12 2222  . QUEtiapine (SEROQUEL) tablet 150 mg  150 mg Oral TID Edelmira Gallogly, MD   150 mg at 07/18/12 5621    Lab Results: No results found for this or any previous visit (from the  past 48 hour(s)).  Physical Findings: AIMS: Facial and Oral Movements Muscles of Facial Expression: None, normal Lips and Perioral Area: None, normal Jaw: None, normal Tongue: None, normal,Extremity Movements Upper (arms, wrists, hands, fingers): None, normal Lower (legs, knees, ankles, toes): None, normal, Trunk Movements Neck, shoulders, hips: None, normal, Overall Severity Severity of abnormal movements (highest score from questions above): None, normal Incapacitation due to abnormal movements: None, normal Patient's awareness of abnormal movements (rate only patient's report): No Awareness, Dental Status Current problems with teeth and/or dentures?: No Does  patient usually wear dentures?: No  CIWA:  CIWA-Ar Total: 1 COWS:     Treatment Plan Summary: Daily contact with patient to assess and evaluate symptoms and progress in treatment Medication management  Plan: Continue current plan of care. Follow up with obtaining bed at [facility] on outpatient basis. Plan for discharge tomorrow if stable.  Medical Decision Making Problem Points:  Established problem, stable/improving (1), Review of last therapy session (1) and Review of psycho-social stressors (1) Data Points:  Review of medication regiment & side effects (2) Review of new medications or change in dosage (2)  I certify that inpatient services furnished can reasonably be expected to improve the patient's condition.   Chrisandra Wiemers 07/18/2012, 10:49 AM

## 2012-07-18 NOTE — Progress Notes (Signed)
Patient ID: Joshua Dillon, male   DOB: 05/20/1985, 27 y.o.   MRN: [id number] D: pt. Lying in bed up ad lib to BR, reports depression at "5" of 10. Pt. Reports no S/H plans but always passive thoughts. Pt. Says he's going to be discharged tomorrow and trying to get to [facility]. A: Writer encouraged pt. [facility] Pt. Verbally contract for safety. Staff will monitor q62min for safety.  Pt. Also reading off and on tonight.

## 2012-07-19 MED ORDER — CARBAMAZEPINE 200 MG PO TABS: 200.0000 mg | ORAL_TABLET | Freq: Three times a day (TID) | ORAL | Status: DC

## 2012-07-19 MED ORDER — QUETIAPINE FUMARATE 50 MG PO TABS: 150.0000 mg | ORAL_TABLET | Freq: Three times a day (TID) | ORAL | Status: DC

## 2012-07-19 MED ORDER — FLUOXETINE HCL 40 MG PO CAPS: 80.0000 mg | ORAL_CAPSULE | Freq: Every day | ORAL | Status: DC

## 2012-07-19 NOTE — BHH Suicide Risk Assessment (Signed)
B[facility] INPATIENT:  Family/Significant Other Suicide Prevention Education  Suicide Prevention Education:  Education Completed;Norlene Duel, mother, [phone],  has been identified by the patient as the family member/significant other with whom the patient will be residing, and identified as the person(s) who will aid the patient in the event of a mental health crisis (suicidal ideations/suicide attempt).  With written consent from the patient, the family member/significant other has been provided the following suicide prevention education, prior to the and/or following the discharge of the patient.  The suicide prevention education provided includes the following:  Suicide risk factors  Suicide prevention and interventions  National Suicide Hotline telephone number  [facility] assessment telephone number  [facility] Emergency Assistance 911  [facility] and/or Residential Mobile Crisis Unit telephone number  Request made of family/significant other to:  Remove weapons (e.g., guns, rifles, knives), all items previously/currently identified as safety concern.    Remove drugs/medications (over-the-counter, prescriptions, illicit drugs), all items previously/currently identified as a safety concern.  The family member/significant other verbalizes understanding of the suicide prevention education information provided.  The family member/significant other agrees to remove the items of safety concern listed above.  Daryel Gerald B 07/19/2012, 12:14 PM

## 2012-07-19 NOTE — Progress Notes (Signed)
Discharge Note  D: Patient's mood was appropriate to the circumstance, but anxious at times. He reported on the self inventory sheet that his appetite is good, energy level is normal and ability to pay attention is improving. Patient rated depression "3" and feelings of hopelessness "4".  A: Support and encouragement provided to patient. Scheduled medications administered per MD orders. Discharge instructions/prescriptions given to patient. Returned belongings to patient.  R: Patient receptive. Denies SI/HI/AVH. Patient d/c to front lobby without incident. Patient verbalized understanding of discharge instructions and prescriptions.

## 2012-07-19 NOTE — Progress Notes (Signed)
[facility] Adult Case Management Discharge Plan :  Will you be returning to the same living situation after discharge: No. At discharge, do you have transportation home?:Yes,  bus pass Do you have the ability to [facility] for your [facility]  Release of information consent forms completed and in the chart;  Patient's signature needed at discharge.  Patient to Follow up at: Follow-up Information   Follow up with [facility]. (Walk in to their emergency dpt for assessment for services)    Contact information:   [address] [place], [place]  [540] [place] [address]      Follow up with [facility] rehab On 08/01/2012. (8 AM sharp for your screening for admission if you decide to go with plan "b")    Contact information:   [address]  [phone]      Patient denies SI/HI:   Yes,  yes    Safety Planning and Suicide Prevention discussed:  Yes,  yes  Ida Rogue 07/19/2012, 12:13 PM

## 2012-07-19 NOTE — Discharge Summary (Signed)
Physician Discharge Summary Note  Patient:  Joshua Dillon is an 27 y.o., male MRN:  [id number] DOB:  11-10-1985 Patient phone:  [phone] (home)  Patient address:   [address] [place] [address]   Date of Admission:  07/14/2012 Date of Discharge:   Discharge Diagnoses: Principal Problem:   Mood disorder Active Problems:   ADHD (attention deficit hyperactivity disorder)   Anxiety   Depression   Alcohol abuse   Marijuana use   Tobacco use   Alcohol dependence  Axis Diagnosis: AXIS I: Alcohol Abuse and Substance Induced Mood Disorder  AXIS II: Deferred  AXIS III:  Past Medical History   Diagnosis  Date   .  Depression    .  ADHD (attention deficit hyperactivity disorder)    .  Anxiety    .  Alcoholism    .  Seizure  03/2012     Secondary to tramadol.    AXIS IV: housing problems, occupational problems and other psychosocial or environmental problems  AXIS V: 61-70 mild symptoms    Level of Care:  Inpatient Hospitalization.  Hospital Course:   27 yo WM who presented to the [facility]  reporting relapsing on beer and being suicidal with a plan to take an OD of Tylenol. He reports that he had been at an [facility] until he relapsed on Thursday drinking a 40% proof beverage called a Mad Dragon. He does not know how many he consumed. He became suicidal when he realized he could not return to the [facility] and is now homeless.  Patient was cooperative on unit, was started on Librium for detox from alcohol along with his other medications for mood. He attended groups and was interested in obtaining substance abuse treatment after discharge.   The patient attended treatment team meeting this am and met with treatment team members. The patient's symptoms, treatment plan and response to treatment was discussed. The patient endorsed that their symptoms have improved. The patient also stated that they felt stable for discharge.  They reported that from this hospital stay  they had learned many coping skills.  In other to maintain their psychiatric stability, they will continue psychiatric care on an outpatient basis. They will follow-up as outlined below.  In addition they were instructed  to take all your medications as prescribed by their mental healthcare provider and to report any adverse effects and or reactions from your medicines to their outpatient provider promptly.  The patient is also instructed and cautioned to not engage in alcohol and or illegal drug use while on prescription medicines.  In the event of worsening symptoms the patient is instructed to call the crisis hotline, 911 and or go to the nearest ED for appropriate evaluation and treatment of symptoms.   Also while a patient in this hospital, the patient received medication management for his psychiatric symptoms. They were ordered and received as outlined below:    Medication List    STOP taking these medications       traZODone 50 MG tablet  Commonly known as:  DESYREL      TAKE these medications     Indication   carbamazepine 200 MG tablet  Commonly known as:  TEGRETOL  Take 1 tablet (200 mg total) by mouth 3 (three) times daily.   Indication:  Complex Partial Epilepsy     FLUoxetine 40 MG capsule  Commonly known as:  PROZAC  Take 2 capsules (80 mg total) by mouth daily.   Indication:  Depression     QUEtiapine 50 MG tablet  Commonly known as:  SEROQUEL  Take 3 tablets (150 mg total) by mouth 3 (three) times daily.   Indication:  Depressive Phase of Manic-Depression       They were also enrolled in group counseling sessions and activities in which they participated actively.       Follow-up Information   Follow up with [facility]. (Walk in to their emergency dpt for assessment for services)    Contact information:   [address], [place], [place]  [540] [place] [address]      Follow up with [facility] rehab.      Upon discharge, patient adamantly denies suicidal, homicidal  ideations, auditory, visual hallucinations and or delusional thinking. They left [facility] with all personal belongings via public transportation in no apparent distress.  Consults:  Please see electronic medical record for details.  Significant Diagnostic Studies:  Please see electronic medical record for details.  Discharge Vitals:   Blood pressure 139/82, pulse 99, temperature 98.3 F (36.8 C), temperature source Oral, resp. rate 20, height 6' (1.829 m), weight 97.523 kg (215 lb)..  Mental Status Exam: See Mental Status Examination and Suicide Risk Assessment completed by Attending Physician prior to discharge.  Discharge destination:  unknown  Is patient on multiple antipsychotic therapies at discharge:  No  Has Patient had three or more failed trials of antipsychotic monotherapy by history: N/A Recommended Plan for Multiple Antipsychotic Therapies: N/A Discharge Orders   Future Orders Complete By Expires     Diet - low sodium heart healthy  As directed     Increase activity slowly  As directed         Medication List    STOP taking these medications       traZODone 50 MG tablet  Commonly known as:  DESYREL      TAKE these medications     Indication   carbamazepine 200 MG tablet  Commonly known as:  TEGRETOL  Take 1 tablet (200 mg total) by mouth 3 (three) times daily.   Indication:  Complex Partial Epilepsy     FLUoxetine 40 MG capsule  Commonly known as:  PROZAC  Take 2 capsules (80 mg total) by mouth daily.   Indication:  Depression     QUEtiapine 50 MG tablet  Commonly known as:  SEROQUEL  Take 3 tablets (150 mg total) by mouth 3 (three) times daily.   Indication:  Depressive Phase of Manic-Depression           Follow-up Information   Follow up with [place] [facility]. (Walk in to their emergency dpt for assessment for services)    Contact information:   [address], [place], [place]  [540] [place] [address]      Follow up with [facility] rehab.     Follow-up  recommendations:   Activities: Resume typical activities Diet: Resume typical diet Other: Follow up with outpatient provider and report any side effects to out patient prescriber.  Comments:  Take all your medications as prescribed by your mental healthcare provider. Report any adverse effects and or reactions from your medicines to your outpatient provider promptly. Patient is instructed and cautioned to not engage in alcohol and or illegal drug use while on prescription medicines. In the event of worsening symptoms, patient is instructed to call the crisis hotline, 911 and or go to the nearest ED for appropriate evaluation and treatment of symptoms. Follow-up with your primary care provider for your other medical  issues, concerns and or health care needs.  SignedPatrick North 07/19/2012 11:39 AM

## 2012-07-19 NOTE — BHH Suicide Risk Assessment (Signed)
Suicide Risk Assessment  Discharge Assessment     Demographic Factors:  Male, Caucasian, Low socioeconomic status and Unemployed  Mental Status Per Nursing Assessment::   On Admission:     Current Mental Status by Physician: Patient alert and oriented to 4. DEnies AH/VH/SI/HI.  Loss Factors: Financial problems/change in socioeconomic status  Historical Factors: Impulsivity  Risk Reduction Factors:   Positive social support and Positive coping skills or problem solving skills  Continued Clinical Symptoms:  Alcohol/Substance Abuse/Dependencies  Cognitive Features That Contribute To Risk:  Thought constriction (tunnel vision)    Suicide Risk:  Minimal: No identifiable suicidal ideation.  Patients presenting with no risk factors but with morbid ruminations; may be classified as minimal risk based on the severity of the depressive symptoms  Discharge Diagnoses:   AXIS I:  Alcohol Abuse and Substance Induced Mood Disorder AXIS II:  Deferred AXIS III:   Past Medical History  Diagnosis Date  . Depression   . ADHD (attention deficit hyperactivity disorder)   . Anxiety   . Alcoholism   . Seizure 03/2012    Secondary to tramadol.   AXIS IV:  housing problems, occupational problems and other psychosocial or environmental problems AXIS V:  61-70 mild symptoms  Plan Of Care/Follow-up recommendations:  Activity:  as tolerated Diet:  regular Follow up with outpatient appointments.  Is patient on multiple antipsychotic therapies at discharge:  No   Has Patient had three or more failed trials of antipsychotic monotherapy by history:  No  Recommended Plan for Multiple Antipsychotic Therapies: NA  Cleona Doubleday 07/19/2012, 10:42 AM

## 2012-07-24 NOTE — Progress Notes (Signed)
Patient Discharge Instructions:  After Visit Summary (AVS):   Faxed to:  07/24/12 Discharge Summary Note:   Faxed to:  07/24/12 Psychiatric Admission Assessment Note:   Faxed to:  07/24/12 Suicide Risk Assessment - Discharge Assessment:   Faxed to:  07/24/12 Faxed/Sent to the Next Level Care provider:  07/24/12 Faxed to [facility] @ [phone] Faxed to [facility] @ [phone]  Jerelene Redden, 07/24/2012, 1:02 PM

## 2013-07-16 ENCOUNTER — Encounter (HOSPITAL_COMMUNITY): Payer: Self-pay | Admitting: Emergency Medicine

## 2013-07-16 ENCOUNTER — Emergency Department (HOSPITAL_COMMUNITY)
Admission: EM | Admit: 2013-07-16 | Discharge: 2013-07-16 | Disposition: A | Discharge status: Home / Self Care | Payer: Non-veteran care | Attending: Emergency Medicine | Admitting: Emergency Medicine

## 2013-07-16 DIAGNOSIS — H6692 Otitis media, unspecified, left ear: Secondary | ICD-10-CM

## 2013-07-16 DIAGNOSIS — Z79899 Other long term (current) drug therapy: Secondary | ICD-10-CM | POA: Insufficient documentation

## 2013-07-16 DIAGNOSIS — F411 Generalized anxiety disorder: Secondary | ICD-10-CM | POA: Insufficient documentation

## 2013-07-16 DIAGNOSIS — F17200 Nicotine dependence, unspecified, uncomplicated: Secondary | ICD-10-CM | POA: Insufficient documentation

## 2013-07-16 DIAGNOSIS — H6690 Otitis media, unspecified, unspecified ear: Secondary | ICD-10-CM | POA: Insufficient documentation

## 2013-07-16 DIAGNOSIS — F3289 Other specified depressive episodes: Secondary | ICD-10-CM | POA: Insufficient documentation

## 2013-07-16 DIAGNOSIS — E7800 Pure hypercholesterolemia, unspecified: Secondary | ICD-10-CM | POA: Insufficient documentation

## 2013-07-16 DIAGNOSIS — F1021 Alcohol dependence, in remission: Secondary | ICD-10-CM | POA: Insufficient documentation

## 2013-07-16 DIAGNOSIS — G40909 Epilepsy, unspecified, not intractable, without status epilepticus: Secondary | ICD-10-CM | POA: Insufficient documentation

## 2013-07-16 DIAGNOSIS — F329 Major depressive disorder, single episode, unspecified: Secondary | ICD-10-CM | POA: Insufficient documentation

## 2013-07-16 HISTORY — DX: Pure hypercholesterolemia, unspecified: E78.00

## 2013-07-16 MED ORDER — IBUPROFEN 800 MG PO TABS
800.0000 mg | ORAL_TABLET | Freq: Once | ORAL | Status: AC
  Administered 2013-07-16: 800 mg via ORAL
  Filled 2013-07-16: qty 1

## 2013-07-16 MED ORDER — ONDANSETRON 8 MG PO TBDP
8.0000 mg | ORAL_TABLET | Freq: Once | ORAL | Status: AC
Start: 2013-07-16 — End: 2013-07-16
  Administered 2013-07-16: 8 mg via ORAL
  Filled 2013-07-16: qty 2

## 2013-07-16 MED ORDER — AMOXICILLIN 500 MG PO CAPS: 1000.0000 mg | ORAL_CAPSULE | Freq: Two times a day (BID) | ORAL | Status: DC

## 2013-07-16 MED ORDER — AMOXICILLIN 250 MG PO CAPS
1000.0000 mg | ORAL_CAPSULE | Freq: Once | ORAL | Status: AC
  Administered 2013-07-16: 1000 mg via ORAL
  Filled 2013-07-16: qty 4

## 2013-07-16 MED ORDER — OXYCODONE-ACETAMINOPHEN 5-325 MG PO TABS: 1.0000 | ORAL_TABLET | ORAL | Status: DC | PRN

## 2013-07-16 NOTE — ED Notes (Signed)
Pain in left ear for couple of days, also c/o sinus drainage and nausea.

## 2013-07-16 NOTE — ED Notes (Signed)
Pt alert & oriented x4, stable gait. Patient given discharge instructions, paperwork & prescription(s). Patient  instructed to stop at the registration desk to finish any additional paperwork. Patient verbalized understanding. Pt left department w/ no further questions. 

## 2013-07-16 NOTE — Discharge Instructions (Signed)
Take Acetaminophen, Ibuprofen, or Naproxen as needed for pain. Do not EVER put a Q-Tip in your ear.  Otitis Media, Adult Otitis media is redness, soreness, and swelling (inflammation) of the middle ear. Otitis media may be caused by allergies or, most commonly, by infection. Often it occurs as a complication of the common cold. SIGNS AND SYMPTOMS Symptoms of otitis media may include:  Earache.  Fever.  Ringing in your ear.  Headache.  Leakage of fluid from the ear. DIAGNOSIS To diagnose otitis media, your health care provider will examine your ear with an otoscope. This is an instrument that allows your health care provider to see into your ear in order to examine your eardrum. Your health care provider also will ask you questions about your symptoms. TREATMENT  Typically, otitis media resolves on its own within 3 5 days. Your health care provider may prescribe medicine to ease your symptoms of pain. If otitis media does not resolve within 5 days or is recurrent, your health care provider may prescribe antibiotic medicines if he or she suspects that a bacterial infection is the cause. HOME CARE INSTRUCTIONS   Take your medicine as directed until it is gone, even if you feel better after the first few days.  Only take over-the-counter or prescription medicines for pain, discomfort, or fever as directed by your health care provider.  Follow up with your health care provider as directed. SEEK MEDICAL CARE IF:  You have otitis media only in one ear or bleeding from your nose or both.  You notice a lump on your neck.  You are not getting better in 3 5 days.  You feel worse instead of better. SEEK IMMEDIATE MEDICAL CARE IF:   You have pain that is not controlled with medicine.  You have swelling, redness, or pain around your ear or stiffness in your neck.  You notice that part of your face is paralyzed.  You notice that the bone behind your ear (mastoid) is tender when you touch  it. MAKE SURE YOU:   Understand these instructions.  Will watch your condition.  Will get help right away if you are not doing well or get worse. Document Released: 01/07/2004 Document Revised: 01/22/2013 Document Reviewed: 10/29/2012 [facility] Patient Information 2014 [place], [place].  Amoxicillin capsules or tablets What is this medicine? AMOXICILLIN (a mox i SIL in) is a penicillin antibiotic. It is used to treat certain kinds of bacterial infections. It will not work for colds, flu, or other viral infections. This medicine may be used for other purposes; ask your health care provider or pharmacist if you have questions. COMMON BRAND NAME(S): Amoxil, Moxilin , Sumox, Trimox What should I tell my health care provider before I take this medicine? They need to know if you have any of these conditions: -asthma -kidney disease -an unusual or allergic reaction to amoxicillin, other penicillins, cephalosporin antibiotics, other medicines, foods, dyes, or preservatives -pregnant or trying to get pregnant -breast-feeding How should I use this medicine? Take this medicine by mouth with a glass of water. Follow the directions on your prescription label. You may take this medicine with food or on an empty stomach. Take your medicine at regular intervals. Do not take your medicine more often than directed. Take all of your medicine as directed even if you think your are better. Do not skip doses or stop your medicine early. Talk to your pediatrician regarding the use of this medicine in children. While this drug may be prescribed for selected  conditions, precautions do apply. Overdosage: If you think you have taken too much of this medicine contact a poison control center or emergency room at once. NOTE: This medicine is only for you. Do not share this medicine with others. What if I miss a dose? If you miss a dose, take it as soon as you can. If it is almost time for your next dose, take only that  dose. Do not take double or extra doses. What may interact with this medicine? -amiloride -birth control pills -chloramphenicol -macrolides -probenecid -sulfonamides -tetracyclines This list may not describe all possible interactions. Give your health care provider a list of all the medicines, herbs, non-prescription drugs, or dietary supplements you use. Also tell them if you smoke, drink alcohol, or use illegal drugs. Some items may interact with your medicine. What should I watch for while using this medicine? Tell your doctor or health care professional if your symptoms do not improve in 2 or 3 days. Take all of the doses of your medicine as directed. Do not skip doses or stop your medicine early. If you are diabetic, you may get a false positive result for sugar in your urine with certain brands of urine tests. Check with your doctor. Do not treat diarrhea with over-the-counter products. Contact your doctor if you have diarrhea that lasts more than 2 days or if the diarrhea is severe and watery. What side effects may I notice from receiving this medicine? Side effects that you should report to your doctor or health care professional as soon as possible: -allergic reactions like skin rash, itching or hives, swelling of the face, lips, or tongue -breathing problems -dark urine -redness, blistering, peeling or loosening of the skin, including inside the mouth -seizures -severe or watery diarrhea -trouble passing urine or change in the amount of urine -unusual bleeding or bruising -unusually weak or tired -yellowing of the eyes or skin Side effects that usually do not require medical attention (report to your doctor or health care professional if they continue or are bothersome): -dizziness -headache -stomach upset -trouble sleeping This list may not describe all possible side effects. Call your doctor for medical advice about side effects. You may report side effects to FDA at  1-800-FDA-1088. Where should I keep my medicine? Keep out of the reach of children. Store between 68 and 77 degrees F (20 and 25 degrees C). Keep bottle closed tightly. Throw away any unused medicine after the expiration date. NOTE: This sheet is a summary. It may not cover all possible information. If you have questions about this medicine, talk to your doctor, pharmacist, or health care provider.  2014, Elsevier/Gold Standard. (2007-06-25 14:10:59)  Smoking Hazards Smoking cigarettes is extremely bad for your health. Tobacco smoke has over 200 known poisons in it. It contains the poisonous gases nitrogen oxide and carbon monoxide. There are over 60 chemicals in tobacco smoke that cause cancer. Some of the chemicals found in cigarette smoke include:   Cyanide.   Benzene.   Formaldehyde.   Methanol (wood alcohol).   Acetylene (fuel used in welding torches).   Ammonia.  Even smoking lightly shortens your life expectancy by several years. You can greatly reduce the risk of medical problems for you and your family by stopping now. Smoking is the most preventable cause of death and disease in our society. Within days of quitting smoking, your circulation improves, you decrease the risk of having a heart attack, and your lung capacity improves. There may be some increased  phlegm in the first few days after quitting, and it may take months for your lungs to clear up completely. Quitting for 10 years reduces your risk of developing lung cancer to almost that of a nonsmoker.  WHAT ARE THE RISKS OF SMOKING? Cigarette smokers have an increased risk of many serious medical problems, including:  Lung cancer.   Lung disease (such as pneumonia, bronchitis, and emphysema).   Heart attack and chest pain due to the heart not getting enough oxygen (angina).   Heart disease and peripheral blood vessel disease.   Hypertension.   Stroke.   Oral cancer (cancer of the lip, mouth, or voice  box).   Bladder cancer.   Pancreatic cancer.   Cervical cancer.   Pregnancy complications, including premature birth.   Stillbirths and smaller newborn babies, birth defects, and genetic damage to sperm.   Early menopause.   Lower estrogen level for women.   Infertility.   Facial wrinkles.   Blindness.   Increased risk of broken bones (fractures).   Senile dementia.   Stomach ulcers and internal bleeding.   Delayed wound healing and increased risk of complications during surgery. Because of secondhand smoke exposure, children of smokers have an increased risk of the following:   Sudden infant death syndrome (SIDS).   Respiratory infections.   Lung cancer.   Heart disease.   Ear infections.  WHY IS SMOKING ADDICTIVE? Nicotine is the chemical agent in tobacco that is capable of causing addiction or dependence. When you smoke and inhale, nicotine is absorbed rapidly into the bloodstream through your lungs. Both inhaled and noninhaled nicotine may be addictive.  WHAT ARE THE BENEFITS OF QUITTING?  There are many health benefits to quitting smoking. Some are:   The likelihood of developing cancer and heart disease decreases. Health improvements are seen almost immediately.   Blood pressure, pulse rate, and breathing patterns start returning to normal soon after quitting.   People who quit may see an improvement in their overall quality of life.  HOW DO YOU QUIT SMOKING? Smoking is an addiction with both physical and psychological effects, and longtime habits can be hard to change. Your health care provider can recommend:  Programs and community resources, which may include group support, education, or therapy.  Replacement products, such as patches, gum, and nasal sprays. Use these products only as directed. Do not replace cigarette smoking with electronic cigarettes (commonly called e-cigarettes). The safety of e-cigarettes is unknown, and some  may contain harmful chemicals. FOR MORE INFORMATION  American Lung Association: www.lung.org  American Cancer Society: www.cancer.org Document Released: 05/11/2004 Document Revised: 01/22/2013 Document Reviewed: 09/23/2012 [facility] Patient Information 2014 [place], [place].

## 2013-07-16 NOTE — ED Provider Notes (Signed)
CSN: [id number]     Arrival date & time 07/16/13  0050 History   First MD Initiated Contact with Patient 07/16/13 0107     Chief complaint: Ear pain  (Consider location/radiation/quality/duration/timing/severity/associated sxs/prior Treatment) The history is provided by the patient.   28 year old male has been having nasal congestion and left ear pain for the last several months but it has gotten worse over the last 3 nights. He states he is unable to sleep because of pain. He rates pain at 7/10. Nothing makes it better nothing makes it worse. He denies fever or chills. Denies sore throat or cough. He tried putting some peroxide in his ear but the pain actually got worse. He tried to clean with peroxide out with a Q-tip and following that, he started having some nausea. He has not noted any decrease in hearing. He had tried taking gabapentin at home for pain without relief.   Past Medical History  Diagnosis Date  . Depression   . ADHD (attention deficit hyperactivity disorder)   . Anxiety   . Alcoholism   . Seizure 03/2012    Secondary to tramadol.  . Hypercholesterolemia    History reviewed. No pertinent past surgical history. Family History  Problem Relation Age of Onset  . Drug abuse Mother     narcotics  . Alcohol abuse Paternal Grandmother   . Anxiety disorder Father   . Bipolar disorder Mother    History  Substance Use Topics  . Smoking status: Current Every Day Smoker -- 0.30 packs/day for 8 years    Types: Cigarettes  . Smokeless tobacco: Current User    Types: Snuff, Chew  . Alcohol Use: 16.5 oz/week    33 drink(s) per week     Comment: daily    Review of Systems  All other systems reviewed and are negative.      Allergies  Review of patient's allergies indicates no known allergies.  Home Medications   Current Outpatient Rx  Name  Route  Sig  Dispense  Refill  . atorvastatin (LIPITOR) 40 MG tablet   Oral   Take 40 mg by mouth daily.         Marland Kitchen.  gabapentin (NEURONTIN) 300 MG capsule   Oral   Take 300 mg by mouth 3 (three) times daily.         . QUEtiapine (SEROQUEL) 50 MG tablet   Oral   Take 3 tablets (150 mg total) by mouth 3 (three) times daily.   90 tablet   0   . venlafaxine (EFFEXOR) 100 MG tablet   Oral   Take 100 mg by mouth 2 (two) times daily.         . carbamazepine (TEGRETOL) 200 MG tablet   Oral   Take 1 tablet (200 mg total) by mouth 3 (three) times daily.   90 tablet   0   . FLUoxetine (PROZAC) 40 MG capsule   Oral   Take 2 capsules (80 mg total) by mouth daily.   60 capsule   0    BP 157/96  Pulse 84  Temp(Src) 98.1 F (36.7 C) (Oral)  Resp 20  Ht 6' (1.829 m)  Wt 195 lb (88.451 kg)  BMI 26.44 kg/m2  SpO2 97% Physical Exam  Nursing note and vitals reviewed.  28 year old male, resting comfortably and in no acute distress. Vital signs are significant for hypertension with blood pressure 137/96. Oxygen saturation is 97%, which is normal. Head is normocephalic and atraumatic.  PERRLA, EOMI. Oropharynx is clear. Left tympanic membrane is erythematous. There is no cerumen impaction and no inflammation of the external auditory canal. Examination of the nasal cavity shows no drainage and no edema of the turbinates. There is no sinus tenderness. Neck is nontender and supple without adenopathy or JVD. Back is nontender and there is no CVA tenderness. Lungs are clear without rales, wheezes, or rhonchi. Chest is nontender. Heart has regular rate and rhythm without murmur. Abdomen is soft, flat, nontender without masses or hepatosplenomegaly and peristalsis is normoactive. Extremities have no cyanosis or edema, full range of motion is present. Skin is warm and dry without rash. Neurologic: Mental status is normal, cranial nerves are intact, there are no motor or sensory deficits.  ED Course  Procedures (including critical care time)  MDM   Final diagnoses:  Otitis media, left    Otitis media  of the left ear. No evidence of sinusitis. Patient is advised not to use cotton tip applicators in his ear and told to use over-the-counter analgesics as needed for pain. He is given a prescription for amoxicillin and is also given a to go pack of oxycodone-acetaminophen. He is advised to stop smoking.    Dione Booze, MD 07/16/13 [phone]

## 2013-07-28 MED FILL — Oxycodone w/ Acetaminophen Tab 5-325 MG: ORAL | Qty: 6 | Status: AC

## 2013-08-05 ENCOUNTER — Emergency Department: Payer: Self-pay | Admitting: Emergency Medicine

## 2013-08-05 LAB — CBC
HCT: 56.7 % — ABNORMAL HIGH (ref 40.0–52.0)
HGB: 18.5 g/dL — AB (ref 13.0–18.0)
MCH: 30.9 pg (ref 26.0–34.0)
MCHC: 32.7 g/dL (ref 32.0–36.0)
MCV: 95 fL (ref 80–100)
Platelet: 310 10*3/uL (ref 150–440)
RBC: 5.99 10*6/uL — AB (ref 4.40–5.90)
RDW: 13.6 % (ref 11.5–14.5)
WBC: 15.1 10*3/uL — ABNORMAL HIGH (ref 3.8–10.6)

## 2013-08-05 LAB — BASIC METABOLIC PANEL
Anion Gap: 2 — ABNORMAL LOW (ref 7–16)
BUN: 11 mg/dL (ref 7–18)
CO2: 29 mmol/L (ref 21–32)
Calcium, Total: 8.2 mg/dL — ABNORMAL LOW (ref 8.5–10.1)
Chloride: 110 mmol/L — ABNORMAL HIGH (ref 98–107)
Creatinine: 1.33 mg/dL — ABNORMAL HIGH (ref 0.60–1.30)
EGFR (African American): >60
EGFR (Non-African Amer.): >60
GLUCOSE: 90 mg/dL (ref 65–99)
Osmolality: 280 (ref 275–301)
POTASSIUM: 3.5 mmol/L (ref 3.5–5.1)
SODIUM: 141 mmol/L (ref 136–145)

## 2013-08-05 LAB — URINALYSIS, COMPLETE
BILIRUBIN, UR: NEGATIVE
Bacteria: NONE SEEN
Blood: NEGATIVE
Glucose,UR: NEGATIVE mg/dL (ref 0–75)
Hyaline Cast: 76
Leukocyte Esterase: NEGATIVE
NITRITE: NEGATIVE
Ph: 5 (ref 4.5–8.0)
RBC, UR: NONE SEEN /HPF (ref 0–5)
SPECIFIC GRAVITY: 1.027 (ref 1.003–1.030)
Squamous Epithelial: <1
WBC UR: 1 /HPF (ref 0–5)

## 2013-08-05 LAB — D-DIMER(ARMC): D-DIMER: 349 ng/mL

## 2013-08-05 LAB — COMPREHENSIVE METABOLIC PANEL
ALT: 28 U/L (ref 12–78)
Albumin: 4.6 g/dL (ref 3.4–5.0)
Alkaline Phosphatase: 116 U/L
Anion Gap: 6 — ABNORMAL LOW (ref 7–16)
BILIRUBIN TOTAL: 0.8 mg/dL (ref 0.2–1.0)
BUN: 13 mg/dL (ref 7–18)
CALCIUM: 9.6 mg/dL (ref 8.5–10.1)
Chloride: 106 mmol/L (ref 98–107)
Co2: 27 mmol/L (ref 21–32)
Creatinine: 1.7 mg/dL — ABNORMAL HIGH (ref 0.60–1.30)
EGFR (Non-African Amer.): 53 — ABNORMAL LOW
Glucose: 106 mg/dL — ABNORMAL HIGH (ref 65–99)
Osmolality: 278 (ref 275–301)
Potassium: 3.1 mmol/L — ABNORMAL LOW (ref 3.5–5.1)
SGOT(AST): 31 U/L (ref 15–37)
Sodium: 139 mmol/L (ref 136–145)
TOTAL PROTEIN: 8.8 g/dL — AB (ref 6.4–8.2)

## 2013-08-05 LAB — CK TOTAL AND CKMB (NOT AT ARMC)
CK, Total: 167 U/L
CK-MB: 1.9 ng/mL (ref 0.5–3.6)

## 2013-08-05 LAB — TSH: Thyroid Stimulating Horm: 2.69 u[IU]/mL

## 2013-08-05 LAB — TROPONIN I: Troponin-I: <0.02 ng/mL

## 2014-02-11 ENCOUNTER — Emergency Department (HOSPITAL_COMMUNITY)
Admission: EM | Admit: 2014-02-11 | Discharge: 2014-02-11 | Disposition: A | Discharge status: Home / Self Care | Payer: Non-veteran care | Attending: Emergency Medicine | Admitting: Emergency Medicine

## 2014-02-11 ENCOUNTER — Encounter (HOSPITAL_COMMUNITY): Payer: Self-pay | Admitting: Emergency Medicine

## 2014-02-11 DIAGNOSIS — R531 Weakness: Secondary | ICD-10-CM | POA: Diagnosis present

## 2014-02-11 DIAGNOSIS — Z720 Tobacco use: Secondary | ICD-10-CM | POA: Insufficient documentation

## 2014-02-11 DIAGNOSIS — Z79899 Other long term (current) drug therapy: Secondary | ICD-10-CM | POA: Insufficient documentation

## 2014-02-11 DIAGNOSIS — G479 Sleep disorder, unspecified: Secondary | ICD-10-CM | POA: Diagnosis not present

## 2014-02-11 DIAGNOSIS — E780 Pure hypercholesterolemia: Secondary | ICD-10-CM | POA: Diagnosis not present

## 2014-02-11 DIAGNOSIS — R630 Anorexia: Secondary | ICD-10-CM | POA: Diagnosis not present

## 2014-02-11 DIAGNOSIS — F10229 Alcohol dependence with intoxication, unspecified: Secondary | ICD-10-CM | POA: Diagnosis not present

## 2014-02-11 DIAGNOSIS — Z8669 Personal history of other diseases of the nervous system and sense organs: Secondary | ICD-10-CM | POA: Diagnosis not present

## 2014-02-11 DIAGNOSIS — E876 Hypokalemia: Secondary | ICD-10-CM

## 2014-02-11 DIAGNOSIS — Z792 Long term (current) use of antibiotics: Secondary | ICD-10-CM | POA: Insufficient documentation

## 2014-02-11 DIAGNOSIS — F909 Attention-deficit hyperactivity disorder, unspecified type: Secondary | ICD-10-CM | POA: Diagnosis not present

## 2014-02-11 DIAGNOSIS — F419 Anxiety disorder, unspecified: Secondary | ICD-10-CM | POA: Insufficient documentation

## 2014-02-11 DIAGNOSIS — F329 Major depressive disorder, single episode, unspecified: Secondary | ICD-10-CM | POA: Insufficient documentation

## 2014-02-11 DIAGNOSIS — F10920 Alcohol use, unspecified with intoxication, uncomplicated: Secondary | ICD-10-CM

## 2014-02-11 LAB — CBC WITH DIFFERENTIAL/PLATELET
BASOS PCT: 0 % (ref 0–1)
Basophils Absolute: 0 10*3/uL (ref 0.0–0.1)
EOS ABS: 0 10*3/uL (ref 0.0–0.7)
EOS PCT: 0 % (ref 0–5)
HCT: 44.2 % (ref 39.0–52.0)
HEMOGLOBIN: 16.4 g/dL (ref 13.0–17.0)
LYMPHS ABS: 2.8 10*3/uL (ref 0.7–4.0)
Lymphocytes Relative: 33 % (ref 12–46)
MCH: 33.1 pg (ref 26.0–34.0)
MCHC: 37.1 g/dL — AB (ref 30.0–36.0)
MCV: 89.1 fL (ref 78.0–100.0)
MONOS PCT: 11 % (ref 3–12)
Monocytes Absolute: 0.9 10*3/uL (ref 0.1–1.0)
NEUTROS PCT: 56 % (ref 43–77)
Neutro Abs: 4.8 10*3/uL (ref 1.7–7.7)
PLATELETS: 202 10*3/uL (ref 150–400)
RBC: 4.96 MIL/uL (ref 4.22–5.81)
RDW: 12.8 % (ref 11.5–15.5)
WBC: 8.6 10*3/uL (ref 4.0–10.5)

## 2014-02-11 LAB — BASIC METABOLIC PANEL
ANION GAP: 17 — AB (ref 5–15)
BUN: 5 mg/dL — ABNORMAL LOW (ref 6–23)
CO2: 24 meq/L (ref 19–32)
Calcium: 9.1 mg/dL (ref 8.4–10.5)
Chloride: 99 mEq/L (ref 96–112)
Creatinine, Ser: 1.05 mg/dL (ref 0.50–1.35)
GFR calc Af Amer: >90 mL/min (ref >90)
GFR calc non Af Amer: >90 mL/min (ref >90)
Glucose, Bld: 91 mg/dL (ref 70–99)
Potassium: 3.3 mEq/L — ABNORMAL LOW (ref 3.7–5.3)
SODIUM: 140 meq/L (ref 137–147)

## 2014-02-11 LAB — ETHANOL: ALCOHOL ETHYL (B): 100 mg/dL — AB (ref 0–11)

## 2014-02-11 MED ORDER — POTASSIUM CHLORIDE CRYS ER 20 MEQ PO TBCR
40.0000 meq | EXTENDED_RELEASE_TABLET | Freq: Once | ORAL | Status: AC
  Administered 2014-02-11: 40 meq via ORAL
  Filled 2014-02-11: qty 2

## 2014-02-11 MED ORDER — POTASSIUM CHLORIDE ER 10 MEQ PO TBCR: 10.0000 meq | EXTENDED_RELEASE_TABLET | Freq: Every day | ORAL | Status: DC

## 2014-02-11 NOTE — ED Notes (Signed)
Says he was at work and began to feel weak, Came to ER by EMS.  Says he has not been eating well. Stopped effexor  Over 1 month ago.

## 2014-02-11 NOTE — Discharge Instructions (Signed)
Would [facility] [place] for [place]. Possible symptoms could be related to a posttraumatic stress disorder. Today's workup negative except for mild hypokalemia. Prescription provided to take some potassium pills over the next few days. Return for any new or worse symptoms.

## 2014-02-11 NOTE — ED Notes (Signed)
Patient watching TV at this time. Patient's BP elevated. Patient states that he is anxious and stressed at this time, having financial difficulties.

## 2014-02-11 NOTE — ED Provider Notes (Signed)
CSN: [id number]     Arrival date & time 02/11/14  1812 History   First MD Initiated Contact with Patient 02/11/14 2029     Chief Complaint  Patient presents with  . Weakness     (Consider location/radiation/quality/duration/timing/severity/associated sxs/prior Treatment) Patient is a 28 y.o. male presenting with weakness. The history is provided by the patient and the EMS personnel.  Weakness Pertinent negatives include no chest pain, no abdominal pain, no headaches and no shortness of breath.   patient brought in by EMS. From work place. Patient with near-syncopal episode feeling fatigued and weak it work. Patient states that for the past month he's not eating well not sleeping well. Patient is followed by the [facility]. Patient had a past history of substance abuse. Patient denies chest pain shortness of breath abdominal pain nausea vomiting diarrhea fevers.  Past Medical History  Diagnosis Date  . Depression   . ADHD (attention deficit hyperactivity disorder)   . Anxiety   . Alcoholism   . Seizure 03/2012    Secondary to tramadol.  . Hypercholesterolemia    History reviewed. No pertinent past surgical history. Family History  Problem Relation Age of Onset  . Drug abuse Mother     narcotics  . Alcohol abuse Paternal Grandmother   . Anxiety disorder Father   . Bipolar disorder Mother    History  Substance Use Topics  . Smoking status: Current Every Day Smoker -- 0.30 packs/day for 8 years    Types: Cigarettes  . Smokeless tobacco: Current User    Types: Snuff, Chew  . Alcohol Use: 16.5 oz/week    33 drink(s) per week     Comment: daily    Review of Systems  Constitutional: Positive for appetite change and fatigue. Negative for fever.  HENT: Negative for congestion.   Eyes: Positive for visual disturbance.  Respiratory: Negative for shortness of breath.   Cardiovascular: Negative for chest pain.  Gastrointestinal: Negative for nausea, vomiting and abdominal pain.   Genitourinary: Negative for dysuria.  Musculoskeletal: Negative for myalgias.  Skin: Negative for rash.  Neurological: Positive for weakness and light-headedness. Negative for headaches.  Hematological: Does not bruise/bleed easily.  Psychiatric/Behavioral: Positive for sleep disturbance. Negative for confusion. The patient is nervous/anxious.       Allergies  Review of patient's allergies indicates no known allergies.  Home Medications   Prior to Admission medications   Medication Sig Start Date End Date Taking? Authorizing Provider  amoxicillin (AMOXIL) 500 MG capsule Take 2 capsules (1,000 mg total) by mouth 2 (two) times daily. 07/16/13   Dione Booze, MD  atorvastatin (LIPITOR) 40 MG tablet Take 40 mg by mouth daily.    Historical Provider, MD  carbamazepine (TEGRETOL) 200 MG tablet Take 1 tablet (200 mg total) by mouth 3 (three) times daily. 07/19/12   Himabindu Ravi, MD  FLUoxetine (PROZAC) 40 MG capsule Take 2 capsules (80 mg total) by mouth daily. 07/19/12   Himabindu Ravi, MD  gabapentin (NEURONTIN) 300 MG capsule Take 300 mg by mouth 3 (three) times daily.    Historical Provider, MD  oxyCODONE-acetaminophen (PERCOCET/ROXICET) 5-325 MG per tablet Take 1 tablet by mouth every 4 (four) hours as needed for severe pain. 07/16/13   Dione Booze, MD  potassium chloride (K-DUR) 10 MEQ tablet Take 1 tablet (10 mEq total) by mouth daily. 02/11/14   Vanetta Mulders, MD  QUEtiapine (SEROQUEL) 50 MG tablet Take 3 tablets (150 mg total) by mouth 3 (three) times daily. 07/19/12  Himabindu Daleen Boavi, MD  venlafaxine (EFFEXOR) 100 MG tablet Take 100 mg by mouth 2 (two) times daily.    Historical Provider, MD   BP 150/106  Pulse 98  Temp(Src) 98.7 F (37.1 C) (Oral)  Resp 18  Ht 6' (1.829 m)  Wt 191 lb 9.6 oz (86.909 kg)  BMI 25.98 kg/m2  SpO2 100% Physical Exam  Nursing note and vitals reviewed. Constitutional: He is oriented to person, place, and time. He appears well-developed and well-nourished.  No distress.  HENT:  Head: Normocephalic and atraumatic.  Mouth/Throat: Oropharynx is clear and moist.  Eyes: EOM are normal. Pupils are equal, round, and reactive to light.  Neck: Normal range of motion.  Cardiovascular: Normal rate, regular rhythm and normal heart sounds.   Pulmonary/Chest: Effort normal and breath sounds normal. No respiratory distress.  Abdominal: Soft. Bowel sounds are normal. There is no tenderness.  Musculoskeletal: Normal range of motion.  Neurological: He is alert and oriented to person, place, and time. No cranial nerve deficit.  Skin: Skin is warm. No erythema.    ED Course  Procedures (including critical care time) Labs Review Labs Reviewed  CBC WITH DIFFERENTIAL - Abnormal; Notable for the following:    MCHC 37.1 (*)    All other components within normal limits  BASIC METABOLIC PANEL - Abnormal; Notable for the following:    Potassium 3.3 (*)    BUN 5 (*)    Anion gap 17 (*)    All other components within normal limits  ETHANOL - Abnormal; Notable for the following:    Alcohol, Ethyl (B) 100 (*)    All other components within normal limits   Results for orders placed during the hospital encounter of 02/11/14  CBC WITH DIFFERENTIAL      Result Value Ref Range   WBC 8.6  4.0 - 10.5 K/uL   RBC 4.96  4.22 - 5.81 MIL/uL   Hemoglobin 16.4  13.0 - 17.0 g/dL   HCT 16.144.2  09.639.0 - 04.552.0 %   MCV 89.1  78.0 - 100.0 fL   MCH 33.1  26.0 - 34.0 pg   MCHC 37.1 (*) 30.0 - 36.0 g/dL   RDW 40.912.8  81.111.5 - 91.415.5 %   Platelets 202  150 - 400 K/uL   Neutrophils Relative % 56  43 - 77 %   Neutro Abs 4.8  1.7 - 7.7 K/uL   Lymphocytes Relative 33  12 - 46 %   Lymphs Abs 2.8  0.7 - 4.0 K/uL   Monocytes Relative 11  3 - 12 %   Monocytes Absolute 0.9  0.1 - 1.0 K/uL   Eosinophils Relative 0  0 - 5 %   Eosinophils Absolute 0.0  0.0 - 0.7 K/uL   Basophils Relative 0  0 - 1 %   Basophils Absolute 0.0  0.0 - 0.1 K/uL  BASIC METABOLIC PANEL      Result Value Ref Range    Sodium 140  137 - 147 mEq/L   Potassium 3.3 (*) 3.7 - 5.3 mEq/L   Chloride 99  96 - 112 mEq/L   CO2 24  19 - 32 mEq/L   Glucose, Bld 91  70 - 99 mg/dL   BUN 5 (*) 6 - 23 mg/dL   Creatinine, Ser 7.821.05  0.50 - 1.35 mg/dL   Calcium 9.1  8.4 - 95.610.5 mg/dL   GFR calc non Af Amer >90  >90 mL/min   GFR calc Af Amer >90  >90 mL/min  Anion gap 17 (*) 5 - 15  ETHANOL      Result Value Ref Range   Alcohol, Ethyl (B) 100 (*) 0 - 11 mg/dL     Imaging Review No results found.   EKG Interpretation   Date/Time:  Wednesday February 11 2014 21:00:55 EDT Ventricular Rate:  95 PR Interval:  177 QRS Duration: 96 QT Interval:  356 QTC Calculation: 447 R Axis:   78 Text Interpretation:  Sinus rhythm Probable left atrial enlargement No  previous ECGs available Confirmed by Nasiah Lehenbauer  MD, Whitman Meinhardt ([id number]) on  02/11/2014 9:09:22 PM      MDM   Final diagnoses:  Weakness  Alcohol intoxication, uncomplicated  Hypokalemia    Patient brought in by EMS from work. Patient stated that he has been feeling weak at work he's not been eating well doesn't have much of an appetite not sleeping well. This is been ongoing for a month. [facility] Patient's had a past history of substance abuse.  Patient denies any specific complaints. Felt like he almost passed out today.  Workup here negative except for some mild hypokalemia. EKG without any acute changes. No leukocytosis no significant anemia. No significant electrolyte abnormalities other than a potassium of 3.3. No renal function problems. [facility] at the [facility].  Labs did show alcohol intoxication. Which may have contributed to this patient's symptoms. Patient is functional and can be discharged home.      Vanetta MuldersScott Phyllis Abelson, MD 02/11/14 2114

## 2014-12-30 ENCOUNTER — Inpatient Hospital Stay (HOSPITAL_COMMUNITY)
Admission: AD | Admit: 2014-12-30 | Discharge: 2015-01-03 | DRG: 897 | Disposition: A | Discharge status: Home / Self Care | Payer: No Typology Code available for payment source | Source: Intra-hospital | Attending: Psychiatry | Admitting: Psychiatry

## 2014-12-30 ENCOUNTER — Encounter (HOSPITAL_COMMUNITY): Payer: Self-pay

## 2014-12-30 ENCOUNTER — Emergency Department (HOSPITAL_COMMUNITY)
Admission: EM | Admit: 2014-12-30 | Discharge: 2014-12-30 | Disposition: A | Discharge status: Other Acute Inpatient Hospital | Payer: Non-veteran care | Attending: Emergency Medicine | Admitting: Emergency Medicine

## 2014-12-30 ENCOUNTER — Encounter (HOSPITAL_COMMUNITY): Payer: Self-pay | Admitting: Emergency Medicine

## 2014-12-30 DIAGNOSIS — F1210 Cannabis abuse, uncomplicated: Secondary | ICD-10-CM | POA: Diagnosis not present

## 2014-12-30 DIAGNOSIS — F17210 Nicotine dependence, cigarettes, uncomplicated: Secondary | ICD-10-CM | POA: Diagnosis present

## 2014-12-30 DIAGNOSIS — Z23 Encounter for immunization: Secondary | ICD-10-CM | POA: Insufficient documentation

## 2014-12-30 DIAGNOSIS — F32A Depression, unspecified: Secondary | ICD-10-CM | POA: Diagnosis present

## 2014-12-30 DIAGNOSIS — Z792 Long term (current) use of antibiotics: Secondary | ICD-10-CM | POA: Insufficient documentation

## 2014-12-30 DIAGNOSIS — F10120 Alcohol abuse with intoxication, uncomplicated: Secondary | ICD-10-CM | POA: Diagnosis not present

## 2014-12-30 DIAGNOSIS — Z720 Tobacco use: Secondary | ICD-10-CM | POA: Insufficient documentation

## 2014-12-30 DIAGNOSIS — F1014 Alcohol abuse with alcohol-induced mood disorder: Secondary | ICD-10-CM | POA: Diagnosis present

## 2014-12-30 DIAGNOSIS — F419 Anxiety disorder, unspecified: Secondary | ICD-10-CM | POA: Diagnosis not present

## 2014-12-30 DIAGNOSIS — E780 Pure hypercholesterolemia: Secondary | ICD-10-CM | POA: Diagnosis not present

## 2014-12-30 DIAGNOSIS — R45851 Suicidal ideations: Secondary | ICD-10-CM

## 2014-12-30 DIAGNOSIS — G40909 Epilepsy, unspecified, not intractable, without status epilepticus: Secondary | ICD-10-CM | POA: Insufficient documentation

## 2014-12-30 DIAGNOSIS — F10229 Alcohol dependence with intoxication, unspecified: Secondary | ICD-10-CM | POA: Diagnosis present

## 2014-12-30 DIAGNOSIS — F1010 Alcohol abuse, uncomplicated: Secondary | ICD-10-CM | POA: Diagnosis present

## 2014-12-30 DIAGNOSIS — F1024 Alcohol dependence with alcohol-induced mood disorder: Secondary | ICD-10-CM | POA: Diagnosis present

## 2014-12-30 DIAGNOSIS — Z008 Encounter for other general examination: Secondary | ICD-10-CM | POA: Diagnosis present

## 2014-12-30 DIAGNOSIS — Z79899 Other long term (current) drug therapy: Secondary | ICD-10-CM | POA: Diagnosis not present

## 2014-12-30 DIAGNOSIS — F329 Major depressive disorder, single episode, unspecified: Secondary | ICD-10-CM | POA: Diagnosis present

## 2014-12-30 DIAGNOSIS — F1020 Alcohol dependence, uncomplicated: Secondary | ICD-10-CM | POA: Diagnosis present

## 2014-12-30 DIAGNOSIS — F10920 Alcohol use, unspecified with intoxication, uncomplicated: Secondary | ICD-10-CM

## 2014-12-30 LAB — COMPREHENSIVE METABOLIC PANEL
ALT: 32 U/L (ref 17–63)
AST: 34 U/L (ref 15–41)
Albumin: 4.2 g/dL (ref 3.5–5.0)
Alkaline Phosphatase: 78 U/L (ref 38–126)
Anion gap: 10 (ref 5–15)
BILIRUBIN TOTAL: 0.7 mg/dL (ref 0.3–1.2)
CHLORIDE: 105 mmol/L (ref 101–111)
CO2: 27 mmol/L (ref 22–32)
Calcium: 8.5 mg/dL — ABNORMAL LOW (ref 8.9–10.3)
Creatinine, Ser: 1.25 mg/dL — ABNORMAL HIGH (ref 0.61–1.24)
GFR calc Af Amer: >60 mL/min (ref >60)
GFR calc non Af Amer: >60 mL/min (ref >60)
GLUCOSE: 114 mg/dL — AB (ref 65–99)
POTASSIUM: 3.3 mmol/L — AB (ref 3.5–5.1)
Sodium: 142 mmol/L (ref 135–145)
Total Protein: 6.9 g/dL (ref 6.5–8.1)

## 2014-12-30 LAB — CBC WITH DIFFERENTIAL/PLATELET
Basophils Absolute: 0 10*3/uL (ref 0.0–0.1)
Basophils Relative: 0 %
Eosinophils Absolute: 0 10*3/uL (ref 0.0–0.7)
Eosinophils Relative: 0 %
HEMATOCRIT: 44.4 % (ref 39.0–52.0)
Hemoglobin: 15.7 g/dL (ref 13.0–17.0)
LYMPHS ABS: 2.3 10*3/uL (ref 0.7–4.0)
LYMPHS PCT: 33 %
MCH: 32.7 pg (ref 26.0–34.0)
MCHC: 35.4 g/dL (ref 30.0–36.0)
MCV: 92.5 fL (ref 78.0–100.0)
MONOS PCT: 11 %
Monocytes Absolute: 0.8 10*3/uL (ref 0.1–1.0)
NEUTROS ABS: 3.9 10*3/uL (ref 1.7–7.7)
Neutrophils Relative %: 56 %
PLATELETS: 257 10*3/uL (ref 150–400)
RBC: 4.8 MIL/uL (ref 4.22–5.81)
RDW: 12.5 % (ref 11.5–15.5)
WBC: 6.9 10*3/uL (ref 4.0–10.5)

## 2014-12-30 LAB — RAPID URINE DRUG SCREEN, HOSP PERFORMED
AMPHETAMINES: NOT DETECTED
Barbiturates: NOT DETECTED
Benzodiazepines: NOT DETECTED
Cocaine: NOT DETECTED
Opiates: NOT DETECTED
Tetrahydrocannabinol: POSITIVE — AB

## 2014-12-30 LAB — ETHANOL: Alcohol, Ethyl (B): 247 mg/dL — ABNORMAL HIGH (ref <5)

## 2014-12-30 MED ORDER — TRAZODONE HCL 50 MG PO TABS
50.0000 mg | ORAL_TABLET | Freq: Every evening | ORAL | Status: DC | PRN
  Administered 2015-01-01 – 2015-01-02 (×2): 50 mg via ORAL
  Filled 2014-12-30 (×11): qty 1

## 2014-12-30 MED ORDER — CLONIDINE HCL 0.1 MG PO TABS: 0.1000 mg | ORAL_TABLET | Freq: Two times a day (BID) | ORAL | Status: DC

## 2014-12-30 MED ORDER — ALUM & MAG HYDROXIDE-SIMETH 200-200-20 MG/5ML PO SUSP
30.0000 mL | ORAL | Status: DC | PRN
Start: 2014-12-30 — End: 2015-01-03

## 2014-12-30 MED ORDER — MAGNESIUM HYDROXIDE 400 MG/5ML PO SUSP: 30.0000 mL | Freq: Every day | ORAL | Status: DC | PRN

## 2014-12-30 MED ORDER — QUETIAPINE FUMARATE 100 MG PO TABS: 150.0000 mg | ORAL_TABLET | Freq: Three times a day (TID) | ORAL | Status: DC

## 2014-12-30 MED ORDER — CHLORDIAZEPOXIDE HCL 25 MG PO CAPS
25.0000 mg | ORAL_CAPSULE | Freq: Four times a day (QID) | ORAL | Status: AC
  Administered 2014-12-30 – 2014-12-31 (×5): 25 mg via ORAL
  Filled 2014-12-30 (×6): qty 1

## 2014-12-30 MED ORDER — DULOXETINE HCL 30 MG PO CPEP: 60.0000 mg | ORAL_CAPSULE | Freq: Every day | ORAL | Status: DC

## 2014-12-30 MED ORDER — ACETAMINOPHEN 325 MG PO TABS
650.0000 mg | ORAL_TABLET | Freq: Four times a day (QID) | ORAL | Status: DC | PRN
  Administered 2015-01-02 – 2015-01-03 (×3): 650 mg via ORAL
  Filled 2014-12-30 (×3): qty 2

## 2014-12-30 MED ORDER — CHLORDIAZEPOXIDE HCL 25 MG PO CAPS
25.0000 mg | ORAL_CAPSULE | Freq: Three times a day (TID) | ORAL | Status: AC
  Administered 2015-01-01 – 2015-01-02 (×2): 25 mg via ORAL
  Filled 2014-12-30 (×2): qty 1

## 2014-12-30 MED ORDER — POTASSIUM CHLORIDE CRYS ER 20 MEQ PO TBCR
40.0000 meq | EXTENDED_RELEASE_TABLET | Freq: Once | ORAL | Status: AC
  Administered 2014-12-30: 40 meq via ORAL
  Filled 2014-12-30: qty 2

## 2014-12-30 MED ORDER — CHLORDIAZEPOXIDE HCL 25 MG PO CAPS
25.0000 mg | ORAL_CAPSULE | ORAL | Status: AC
  Administered 2015-01-02 – 2015-01-03 (×2): 25 mg via ORAL
  Filled 2014-12-30 (×2): qty 1

## 2014-12-30 MED ORDER — CHLORDIAZEPOXIDE HCL 25 MG PO CAPS
25.0000 mg | ORAL_CAPSULE | Freq: Four times a day (QID) | ORAL | Status: AC | PRN
  Administered 2015-01-01: 25 mg via ORAL
  Filled 2014-12-30: qty 1

## 2014-12-30 MED ORDER — CHLORDIAZEPOXIDE HCL 25 MG PO CAPS: 25.0000 mg | ORAL_CAPSULE | Freq: Every day | ORAL | Status: DC

## 2014-12-30 MED ORDER — TETANUS-DIPHTH-ACELL PERTUSSIS 5-2.5-18.5 LF-MCG/0.5 IM SUSP
0.5000 mL | Freq: Once | INTRAMUSCULAR | Status: AC
Start: 2014-12-30 — End: 2014-12-30
  Administered 2014-12-30: 0.5 mL via INTRAMUSCULAR
  Filled 2014-12-30: qty 0.5

## 2014-12-30 MED ORDER — DULOXETINE HCL 20 MG PO CPEP
20.0000 mg | ORAL_CAPSULE | Freq: Every day | ORAL | Status: DC
  Administered 2014-12-31: 20 mg via ORAL
  Filled 2014-12-30 (×4): qty 1

## 2014-12-30 MED ORDER — ONDANSETRON 4 MG PO TBDP: 4.0000 mg | ORAL_TABLET | Freq: Four times a day (QID) | ORAL | Status: AC | PRN

## 2014-12-30 MED ORDER — HYDROXYZINE HCL 25 MG PO TABS
25.0000 mg | ORAL_TABLET | Freq: Four times a day (QID) | ORAL | Status: AC | PRN
  Administered 2014-12-31 – 2015-01-01 (×2): 25 mg via ORAL
  Filled 2014-12-30 (×2): qty 1

## 2014-12-30 MED ORDER — ONDANSETRON 8 MG PO TBDP
8.0000 mg | ORAL_TABLET | Freq: Three times a day (TID) | ORAL | Status: DC | PRN
  Administered 2014-12-30: 8 mg via ORAL
  Filled 2014-12-30: qty 1

## 2014-12-30 MED ORDER — CLONIDINE HCL 0.1 MG PO TABS
0.1000 mg | ORAL_TABLET | Freq: Two times a day (BID) | ORAL | Status: DC
  Administered 2014-12-31 – 2015-01-03 (×6): 0.1 mg via ORAL
  Filled 2014-12-30 (×10): qty 1

## 2014-12-30 MED ORDER — NICOTINE 21 MG/24HR TD PT24
21.0000 mg | MEDICATED_PATCH | Freq: Every day | TRANSDERMAL | Status: DC
  Administered 2014-12-30: 21 mg via TRANSDERMAL
  Filled 2014-12-30: qty 1

## 2014-12-30 MED ORDER — QUETIAPINE FUMARATE 50 MG PO TABS
50.0000 mg | ORAL_TABLET | Freq: Three times a day (TID) | ORAL | Status: DC
  Administered 2014-12-30 – 2014-12-31 (×4): 50 mg via ORAL
  Filled 2014-12-30 (×9): qty 1

## 2014-12-30 MED ORDER — ONDANSETRON 4 MG PO TBDP
4.0000 mg | ORAL_TABLET | Freq: Once | ORAL | Status: AC
  Administered 2014-12-30: 4 mg via ORAL
  Filled 2014-12-30: qty 1

## 2014-12-30 MED ORDER — ADULT MULTIVITAMIN W/MINERALS CH
1.0000 | ORAL_TABLET | Freq: Every day | ORAL | Status: DC
  Administered 2014-12-31 – 2015-01-03 (×3): 1 via ORAL
  Filled 2014-12-30 (×6): qty 1

## 2014-12-30 MED ORDER — LORAZEPAM 1 MG PO TABS
1.0000 mg | ORAL_TABLET | Freq: Once | ORAL | Status: AC
  Administered 2014-12-30: 1 mg via ORAL
  Filled 2014-12-30: qty 1

## 2014-12-30 MED ORDER — LORAZEPAM 1 MG PO TABS
1.0000 mg | ORAL_TABLET | ORAL | Status: DC | PRN
  Administered 2014-12-30 (×2): 1 mg via ORAL
  Filled 2014-12-30 (×2): qty 1

## 2014-12-30 MED ORDER — VITAMIN B-1 100 MG PO TABS
100.0000 mg | ORAL_TABLET | Freq: Every day | ORAL | Status: DC
  Administered 2014-12-31 – 2015-01-03 (×3): 100 mg via ORAL
  Filled 2014-12-30 (×6): qty 1

## 2014-12-30 MED ORDER — THIAMINE HCL 100 MG/ML IJ SOLN
100.0000 mg | Freq: Once | INTRAMUSCULAR | Status: AC
  Administered 2014-12-30: 100 mg via INTRAMUSCULAR
  Filled 2014-12-30: qty 2

## 2014-12-30 MED ORDER — LOPERAMIDE HCL 2 MG PO CAPS: 2.0000 mg | ORAL_CAPSULE | ORAL | Status: AC | PRN

## 2014-12-30 NOTE — ED Notes (Signed)
Pt transported to [facility] via [facility] transport.

## 2014-12-30 NOTE — BH Assessment (Addendum)
Tele Assessment Note   Joshua Dillon is a Caucasian, single, employed 29 y.o. male w/ hx of alcohol abuse and depression presenting to [facility] due to alcohol intoxication and SI. Pt presents with disheveled appearance and depressed, anxious mood. Affect is congruent. Pt seems embarrassed and ashamed of his depression and alcohol use, repeatedly attempting to minimize its severity. Pt is restless throughout assessment, as well as slightly irritable and guarded at times. Thought process is circumstantial but evidences no delusional content. Pt does not appear to be responding to internal stimuli. He is oriented x4 and cooperative. Pt reports worsening depression and drinking heavily over the past 3-4 days, triggered by feelings of loneliness. Pt states "I just can't face the day sober lately". Pt states several times during interview that he feels alone and has "no one to talk to" and no social supports. Pt reports cutting his body with a broken light bulb last night, adding "I don't know what happened last night. I've never cut or hurt myself before then". He denies any hx of self-harming behaviors. He denies any past suicide attempts, but per chart review, pt attempted suicide on at least one occasion in the past by cutting his throat. Pt endorses passive SI, stating that he would never kill himself but that he would be fine with it if he just did not wake up one day. Pt reports drinking wine heavily over the past few days "to not feel so alone" but he states that he does not know how much he has been consuming. He claims that he does not drink daily. Pt endorses marijuana use as well, but states that this is not daily either. BAL is 247 and UDS +THC upon arrival to ED. Pt says he has not been taking his naltrexone shot or any of his psych meds over the past 3 months "because I didn't think I needed them". Pt says he now sees that he does needs his medications. Pt reports depressive sx including feelings of  helplessness, insomnia, guilt, loss of interest in usual pleasures, increased irritability, decreased appetite with 40 lb weight loss, lack of motivation, and vegetative sx. He also endorses compulsive behaviors of showering up to 6x per day. Pt has several prior hospitalizations at [facility], [place], and [place]. He has been in SA tx programs in the past. Pt is adamant that he does not want or need inpt tx, stating "I'm not going to be committed anywhere. I'm sorry, but I'm just not." Pt denies HI or A/VH. He denies hx of abuse or trauma.  Per Donell Sievert, PA, Pt meets inpt criteria.    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 Depression   . ADHD (attention deficit hyperactivity disorder)   . Anxiety   . Alcoholism   . Seizure 03/2012    Secondary to tramadol.  . Hypercholesterolemia    Axis IV: educational problems, housing problems, problems related to social environment and problems with primary support group Axis V: 31-40 impairment in reality testing  Past Medical History:  Past Medical History  Diagnosis Date  . Depression   . ADHD (attention deficit hyperactivity disorder)   . Anxiety   . Alcoholism   . Seizure 03/2012    Secondary to tramadol.  . Hypercholesterolemia  History reviewed. No pertinent past surgical history.  Family History:  Family History  Problem Relation Age of Onset  . Drug abuse Mother     narcotics  . Alcohol abuse Paternal Grandmother   . Anxiety disorder Father   . Bipolar disorder Mother     Social History:  reports that he has been smoking Cigarettes.  He has a 2.4 pack-year smoking history. His smokeless tobacco use includes Snuff and Chew. He reports that he drinks about 16.5 oz of alcohol per week. He reports that he uses illicit drugs  (Marijuana and Benzodiazepines) about 7 times per week.  Additional Social History:  Alcohol / Drug Use Pain Medications: See PTA Med List Prescriptions: See PTA Med List Over the Counter: See PTA Med List History of alcohol / drug use?: Yes Longest period of sobriety (when/how long): 2 weeks Negative Consequences of Use: Personal relationships, Work / School Withdrawal Symptoms: Irritability, Nausea / Vomiting, Weakness, Patient aware of relationship between substance abuse and physical/medical complications, Sweats, Fever / Chills, Anorexia Substance #1 Name of Substance 1: Etoh 1 - Age of First Use: 24 1 - Amount (size/oz): "Varies" 1 - Frequency: Several times per month, per pt 1 - Duration: 5 years 1 - Last Use / Amount: Last night, 12/29/14 Substance #2 Name of Substance 2: THC 2 - Age of First Use: teens 2 - Amount (size/oz): 1-2 joints 2 - Frequency: A few times per month, per pt 2 - Duration: Years 2 - Last Use / Amount: Unknown  CIWA: CIWA-Ar BP: 142/90 mmHg Pulse Rate: 107 COWS:    PATIENT STRENGTHS: (choose at least two) Ability for insight Average or above average intelligence Capable of independent living Communication skills Supportive family/friends Work skills  Allergies: No Known Allergies  Home Medications:  (Not in a hospital admission)  OB/GYN Status:  No LMP for male patient.  General Assessment Data Location of Assessment: [facility] TTS Assessment: In system Is this a Tele or Face-to-Face Assessment?: Tele Assessment Is this an Initial Assessment or a Re-assessment for this encounter?: Initial Assessment Marital status: Single Maiden name: n/a Is patient pregnant?: No Pregnancy Status: No Living Arrangements: Other relatives (with grandfather) Can pt return to current living arrangement?: Yes Admission Status: Voluntary Is patient capable of signing voluntary admission?: Yes Referral Source: Self/Family/Friend Insurance type: [employer] traffic [employer]     Crisis Care Plan Living Arrangements: Other relatives (with grandfather) Name of Psychiatrist: [facility] Lindi Ad[facility] at [facility] Name of Therapist: None  Education Status Is patient currently in school?: No Current Grade: na Highest grade of school patient has completed: na Name of school: na Contact person: na  Risk to self with the past 6 months Suicidal Ideation: Yes-Currently Present Has patient been a risk to self within the past 6 months prior to admission? : No Suicidal Intent: No Has patient had any suicidal intent within the past 6 months prior to admission? : No Is patient at risk for suicide?: Yes Suicidal Plan?: Yes-Currently Present Has patient had any suicidal plan within the past 6 months prior to admission? : No Specify Current Suicidal Plan: Pt was cutting self with a broken light bulb last night Access to Means: Yes Specify Access to Suicidal Means: Anything sharp What has been your use of drugs/alcohol within the last 12 months?: Regular Etoh and THC use Previous Attempts/Gestures: Yes How many times?: 1 Other Self Harm Risks: SA Triggers for Past Attempts: Unknown Intentional Self Injurious Behavior: Cutting Comment - Self Injurious Behavior:  Pt cut self with broken light bulb last night. He denies any prior hx of self-harm. Family Suicide History: No Recent stressful life event(s): Conflict (Comment) (Father disowned pt a few days ago, pt reports feeling alone) Persecutory voices/beliefs?: No Depression: Yes Depression Symptoms: Despondent, Insomnia, Isolating, Guilt, Loss of interest in usual pleasures, Feeling worthless/self pity, Feeling angry/irritable Substance abuse history and/or treatment for substance abuse?: Yes Suicide prevention information given to non-admitted patients: Not applicable  Risk to Others within the past 6 months Homicidal Ideation: No Does patient have any lifetime risk of violence toward others beyond the six months prior  to admission? : No Thoughts of Harm to Others: No Current Homicidal Intent: No Current Homicidal Plan: No Access to Homicidal Means: No Identified Victim: n/a History of harm to others?: No Assessment of Violence: None Noted Violent Behavior Description: Pt cooperative. No known hx of violence. Does patient have access to weapons?: No Criminal Charges Pending?: No Does patient have a court date: No Is patient on probation?: No  Psychosis Hallucinations: None noted Delusions: None noted  Mental Status Report Appearance/Hygiene: Disheveled Eye Contact: Fair Motor Activity: Restlessness Speech: Logical/coherent (Stuttering) Level of Consciousness: Restless Mood: Depressed, Anxious, Ashamed/humiliated Affect: Anxious, Irritable Anxiety Level: Moderate Thought Processes: Circumstantial Judgement: Partial Orientation: Person, Place, Time, Situation Obsessive Compulsive Thoughts/Behaviors: Moderate (Compulsive showering 6x per day)  Cognitive Functioning Concentration: Decreased Memory: Recent Intact IQ: Average Insight: Poor Impulse Control: Poor Appetite: Poor Weight Loss: 40 (In past few months) Weight Gain: 0 Sleep: Decreased Total Hours of Sleep: 6 Vegetative Symptoms: Staying in bed  ADLScreening [facility] Assessment Services) Patient's cognitive ability adequate to safely complete daily activities?: Yes Patient able to express need for assistance with ADLs?: Yes Independently performs ADLs?: Yes (appropriate for developmental age)  Prior Inpatient Therapy Prior Inpatient Therapy: Yes Prior Therapy Dates: Multiple Prior Therapy Facilty/Provider(s): [facility], [facility], [place] Reason for Treatment: Alcohol abuse, Depression  Prior Outpatient Therapy Prior Outpatient Therapy: Yes Prior Therapy Dates: Current Prior Therapy Facilty/Provider(s): [place] (Dr Lindi Adie) Reason for Treatment: Depression, Alcohol Abuse Does patient have an ACCT team?: No Does patient have  Intensive In-House Services?  : No Does patient have [facility] services? : No Does patient have P4CC services?: No  ADL Screening (condition at time of admission) Patient's cognitive ability adequate to safely complete daily activities?: Yes Is the patient deaf or have difficulty hearing?: No Does the patient have difficulty seeing, even when wearing glasses/contacts?: No Does the patient have difficulty concentrating, remembering, or making decisions?: No Patient able to express need for assistance with ADLs?: Yes Does the patient have difficulty dressing or bathing?: No Independently performs ADLs?: Yes (appropriate for developmental age) Does the patient have difficulty walking or climbing stairs?: No Weakness of Legs: None Weakness of Arms/Hands: None  Home Assistive Devices/Equipment Home Assistive Devices/Equipment: None    Abuse/Neglect Assessment (Assessment to be complete while patient is alone) Physical Abuse: Denies Verbal Abuse: Denies Sexual Abuse: Denies Exploitation of patient/patient's resources: Denies Self-Neglect: Denies Values / Beliefs Cultural Requests During Hospitalization: None Spiritual Requests During Hospitalization: None   Advance Directives (For Healthcare) Does patient have an advance directive?: No Would patient like information on creating an advanced directive?: No - patient declined information    Additional Information 1:1 In Past 12 Months?: No CIRT Risk: No Elopement Risk: Yes Does patient have medical clearance?: Yes     Disposition: Per Donell Sievert, PA, Pt meets inpt criteria.  Disposition Initial Assessment Completed for this Encounter: Yes Disposition  of Patient: Inpatient treatment program Type of inpatient treatment program: Adult  Cyndie Mull, [facility] Triage Specialist  12/30/2014 7:08 AM

## 2014-12-30 NOTE — ED Notes (Signed)
Breakfast tray given to pt 

## 2014-12-30 NOTE — ED Provider Notes (Signed)
TIME SEEN: 5:30 AM  CHIEF COMPLAINT: "I don't want to be here anymore"  HPI: Pt is a 29 y.o. male with history of depression, alcoholism who presents emergency department with depression and feeling like "I don't want to be here anymore". States worsening depression since stopping his naltrexone injections in June. Presents with superficial cuts to his left upper extremity and chest. States he was cutting himself with a broken light bulb. He is unsure when his last tetanus vaccination was. Denies HI or hallucinations. Denies previous suicide attempt. States he has been drinking wine heavily. Denies drug use. States that he has not been taking any of his home medications since June.  ROS: See HPI Constitutional: no fever  Eyes: no drainage  ENT: no runny nose   Cardiovascular:  no chest pain  Resp: no SOB  GI: no vomiting GU: no dysuria Integumentary: no rash  Allergy: no hives  Musculoskeletal: no leg swelling  Neurological: no slurred speech ROS otherwise negative  PAST MEDICAL HISTORY/PAST SURGICAL HISTORY:  Past Medical History  Diagnosis Date  . Depression   . ADHD (attention deficit hyperactivity disorder)   . Anxiety   . Alcoholism   . Seizure 03/2012    Secondary to tramadol.  . Hypercholesterolemia     MEDICATIONS:  Prior to Admission medications   Medication Sig Start Date End Date Taking? Authorizing Provider  cloNIDine (CATAPRES) 0.1 MG tablet Take 0.1 mg by mouth 2 (two) times daily.   Yes Historical Provider, MD  DULoxetine (CYMBALTA) 60 MG capsule Take 60 mg by mouth daily.   Yes Historical Provider, MD  QUEtiapine (SEROQUEL) 50 MG tablet Take 3 tablets (150 mg total) by mouth 3 (three) times daily. 07/19/12  Yes Himabindu Ravi, MD  amoxicillin (AMOXIL) 500 MG capsule Take 2 capsules (1,000 mg total) by mouth 2 (two) times daily. 07/16/13   Dione Booze, MD  atorvastatin (LIPITOR) 40 MG tablet Take 40 mg by mouth daily.    Historical Provider, MD  carbamazepine  (TEGRETOL) 200 MG tablet Take 1 tablet (200 mg total) by mouth 3 (three) times daily. 07/19/12   Himabindu Ravi, MD  FLUoxetine (PROZAC) 40 MG capsule Take 2 capsules (80 mg total) by mouth daily. 07/19/12   Himabindu Ravi, MD  gabapentin (NEURONTIN) 300 MG capsule Take 300 mg by mouth 3 (three) times daily.    Historical Provider, MD  oxyCODONE-acetaminophen (PERCOCET/ROXICET) 5-325 MG per tablet Take 1 tablet by mouth every 4 (four) hours as needed for severe pain. 07/16/13   Dione Booze, MD  potassium chloride (K-DUR) 10 MEQ tablet Take 1 tablet (10 mEq total) by mouth daily. 02/11/14   Vanetta Mulders, MD  venlafaxine (EFFEXOR) 100 MG tablet Take 100 mg by mouth 2 (two) times daily.    Historical Provider, MD    ALLERGIES:  No Known Allergies  SOCIAL HISTORY:  Social History  Substance Use Topics  . Smoking status: Current Every Day Smoker -- 0.30 packs/day for 8 years    Types: Cigarettes  . Smokeless tobacco: Current User    Types: Snuff, Chew  . Alcohol Use: 16.5 oz/week    33 drink(s) per week     Comment: daily    FAMILY HISTORY: Family History  Problem Relation Age of Onset  . Drug abuse Mother     narcotics  . Alcohol abuse Paternal Grandmother   . Anxiety disorder Father   . Bipolar disorder Mother     EXAM: BP 142/90 mmHg  Pulse 107  Temp(Src)  98.6 F (37 C) (Oral)  Resp 20  Ht 6' (1.829 m)  Wt 210 lb (95.255 kg)  BMI 28.47 kg/m2  SpO2 99% CONSTITUTIONAL: Alert and oriented and responds appropriately to questions. Disheveled, appears intoxicated, smells of alcohol HEAD: Normocephalic EYES: Conjunctivae clear, PERRL ENT: normal nose; no rhinorrhea; moist mucous membranes; pharynx without lesions noted NECK: Supple, no meningismus, no LAD  CARD: RRR; S1 and S2 appreciated; no murmurs, no clicks, no rubs, no gallops RESP: Normal chest excursion without splinting or tachypnea; breath sounds clear and equal bilaterally; no wheezes, no rhonchi, no rales, no hypoxia  or respiratory distress, speaking full sentences ABD/GI: Normal bowel sounds; non-distended; soft, non-tender, no rebound, no guarding, no peritoneal signs BACK:  The back appears normal and is non-tender to palpation, there is no CVA tenderness EXT: Normal ROM in all joints; non-tender to palpation; no edema; normal capillary refill; no cyanosis, no calf tenderness or swelling    SKIN: Normal color for age and race; warm, multiple superficial lacerations to his left arm and anterior chest NEURO: Moves all extremities equally, sensation to light touch intact diffusely, cranial nerves II through XII intact PSYCH: Patient endorses passive suicidal thoughts. No active plan. No HI or hallucinations. Appears disheveled. Patient intoxicated.  MEDICAL DECISION MAKING: Patient here was passive suicidal thoughts. Has been cutting himself with a light bulb today. We'll update his tetanus vaccination. He does not have a lacerations that need repair. We'll consult TTS. Will obtain screening labs and urine.  ED PROGRESS: 7:00 AM  Pt's labs unremarkable other than alcohol of 237. Urine drug screen is positive for THC. I have not reordered any of his medications as he states he has not been taking them for 3 months. He has been evaluated by behavioral health and they recommended inpatient treatment. I will IVC patient given he wants to go home. He is awaiting a bed for placement.     Layla Maw Ward, DO 12/30/14 [phone]

## 2014-12-30 NOTE — ED Notes (Signed)
Pt was given dinner tray.  

## 2014-12-30 NOTE — ED Notes (Signed)
Report given to Erskine Squibb, RN at [facility]. Erskine Squibb requests pt not be transferred until after 1930.

## 2014-12-30 NOTE — [facility] Counselor (Signed)
Disposition: Per Donell Sievert, PA, Pt meets inpt criteria. Per Delorise Jackson, [facility], no appropriate beds available at [facility] currently but bed likely available later today after discharges. Pt under review at [facility].  Counselor unable to reach Dr Elesa Massed, so RN was informed of disposition. Counselor told RN that pt is adamant that he is not going inpt, so pt will probably need to be IVC'ed.   Cyndie Mull, [facility] Triage Specialist [facility]

## 2014-12-30 NOTE — Consult Note (Signed)
Telepsych Consultation   Reason for Consult:  Suicidal ideation, substance abuse Referring Physician:  [facility] Patient Identification: Joshua Dillon MRN:  [id number] Principal Diagnosis: Depression Diagnosis:   Patient Active Problem List   Diagnosis Date Noted  . Mood disorder [F39] 03/27/2012  . Alcohol dependence [F10.20] 03/25/2012  . Marijuana use [F12.10] 03/22/2012  . Elevated AST (SGOT) [R74.0] 03/22/2012  . Smokeless tobacco use [Z72.0] 03/22/2012  . Tobacco use [Z72.0] 03/22/2012  . Seizure [R56.9] 03/19/2012  . Alcohol abuse [F10.10] 03/19/2012  . Hypokalemia [E87.6] 03/19/2012  . ADHD (attention deficit hyperactivity disorder) [F90.9]   . Anxiety [F41.9]   . Depression [F32.9]     Total Time spent with patient: 30 minutes  Subjective:   Joshua Dillon is a 29 y.o. male patient admitted with suicidal ideation.    HPI:  Joshua Dillon was initially seen at [facility] and consult was requested via telepsych for further treatment.  Patient seen via video conference.  He appears disheveled.  He reports that he was intoxicated overnight and he started to cut his chest with a broken light bulb, "I liked how all the blood came out."  To make matters worse he states that the [place] did not have a a forwarding address and he missed taking his Cymbalta.  He was former Social research officer, government.  He endorses a history of depression since his  52-20's.  He states that once before, when he was intoxicated, he wanted to cut his throat.  He reports that he lives with his grandfather and now he is unsure if he can go back because he scared his grandfather with his behavior.    He reports that he likes to drink alone.  Approximately 7-8 beers daily.  He works at [employer] and this does keep him busy and he avoids drinking.  He states he has supportive parents and he feels remorseful that he has put them through much "heartache".  HPI Elements:   Location:  see HPI. Quality:  see HPI. Duration:  See  HPI. Context:  see HPI.  Past Medical History:  Past Medical History  Diagnosis Date  . Depression   . ADHD (attention deficit hyperactivity disorder)   . Anxiety   . Alcoholism   . Seizure 03/2012    Secondary to tramadol.  . Hypercholesterolemia    History reviewed. No pertinent past surgical history. Family History:  Family History  Problem Relation Age of Onset  . Drug abuse Mother     narcotics  . Alcohol abuse Paternal Grandmother   . Anxiety disorder Father   . Bipolar disorder Mother    Social History:  History  Alcohol Use  . 16.5 oz/week  . 33 drink(s) per week    Comment: daily     History  Drug Use  . 7.00 per week  . Special: Marijuana, Benzodiazepines    Social History   Social History  . Marital Status: Single    Spouse Name: N/A  . Number of Children: N/A  . 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 Smoking status: Current Every Day Smoker -- 0.30 packs/day for 8 years    Types: Cigarettes  . Smokeless tobacco: Current User    Types: Snuff, Chew  . Alcohol Use: 16.5 oz/week    33 drink(s) per week     Comment: daily  . Drug Use: 7.00 per week    Special: Marijuana, Benzodiazepines  .  Sexual Activity: Not Currently   Other Topics Concern  . None   Social History Narrative   Additional Social History:    Pain Medications: See PTA Med List Prescriptions: See PTA Med List Over the Counter: See PTA Med List History of alcohol / drug use?: Yes Longest period of sobriety (when/how long): 2 weeks Negative Consequences of Use: Personal relationships, Work / School Withdrawal Symptoms: Irritability, Nausea / Vomiting, Weakness, Patient aware of relationship between substance abuse and physical/medical complications, Sweats, Fever / Chills, Anorexia Name of Substance 1: Etoh 1 - Age of First Use: 24 1 - Amount (size/oz): "Varies" 1 - Frequency: Several times per month,  per pt 1 - Duration: 5 years 1 - Last Use / Amount: Last night, 12/29/14 Name of Substance 2: THC 2 - Age of First Use: teens 2 - Amount (size/oz): 1-2 joints 2 - Frequency: A few times per month, per pt 2 - Duration: Years 2 - Last Use / Amount: Unknown     Allergies:  No Known Allergies  Labs:  Results for orders placed or performed during the hospital encounter of 12/30/14 (from the past 48 hour(s))  Comprehensive metabolic panel     Status: Abnormal   Collection Time: 12/30/14  5:40 AM  Result Value Ref Range   Sodium 142 135 - 145 mmol/L   Potassium 3.3 (L) 3.5 - 5.1 mmol/L   Chloride 105 101 - 111 mmol/L   CO2 27 22 - 32 mmol/L   Glucose, Bld 114 (H) 65 - 99 mg/dL   BUN <5 (L) 6 - 20 mg/dL   Creatinine, Ser 1.25 (H) 0.61 - 1.24 mg/dL   Calcium 8.5 (L) 8.9 - 10.3 mg/dL   Total Protein 6.9 6.5 - 8.1 g/dL   Albumin 4.2 3.5 - 5.0 g/dL   AST 34 15 - 41 U/L   ALT 32 17 - 63 U/L   Alkaline Phosphatase 78 38 - 126 U/L   Total Bilirubin 0.7 0.3 - 1.2 mg/dL   GFR calc non Af Amer >60 >60 mL/min   GFR calc Af Amer >60 >60 mL/min    Comment: (NOTE) The eGFR has been calculated using the CKD EPI equation. This calculation has not been validated in all clinical situations. eGFR's persistently <60 mL/min signify possible Chronic Kidney Disease.    Anion gap 10 5 - 15  Ethanol     Status: Abnormal   Collection Time: 12/30/14  5:40 AM  Result Value Ref Range   Alcohol, Ethyl (B) 247 (H) <5 mg/dL    Comment:        LOWEST DETECTABLE LIMIT FOR SERUM ALCOHOL IS 5 mg/dL FOR MEDICAL PURPOSES ONLY   CBC with Diff     Status: None   Collection Time: 12/30/14  5:40 AM  Result Value Ref Range   WBC 6.9 4.0 - 10.5 K/uL   RBC 4.80 4.22 - 5.81 MIL/uL   Hemoglobin 15.7 13.0 - 17.0 g/dL   HCT 44.4 39.0 - 52.0 %   MCV 92.5 78.0 - 100.0 fL   MCH 32.7 26.0 - 34.0 pg   MCHC 35.4 30.0 - 36.0 g/dL   RDW 12.5 11.5 - 15.5 %   Platelets 257 150 - 400 K/uL   Neutrophils Relative % 56 %    Neutro Abs 3.9 1.7 - 7.7 K/uL   Lymphocytes Relative 33 %   Lymphs Abs 2.3 0.7 - 4.0 K/uL   Monocytes Relative 11 %   Monocytes Absolute 0.8 0.1 -  1.0 K/uL   Eosinophils Relative 0 %   Eosinophils Absolute 0.0 0.0 - 0.7 K/uL   Basophils Relative 0 %   Basophils Absolute 0.0 0.0 - 0.1 K/uL  Urine rapid drug screen (hosp performed)not at [facility]     Status: Abnormal   Collection Time: 12/30/14  5:40 AM  Result Value Ref Range   Opiates NONE DETECTED NONE DETECTED   Cocaine NONE DETECTED NONE DETECTED   Benzodiazepines NONE DETECTED NONE DETECTED   Amphetamines NONE DETECTED NONE DETECTED   Tetrahydrocannabinol POSITIVE (A) NONE DETECTED   Barbiturates NONE DETECTED NONE DETECTED    Comment:        DRUG SCREEN FOR MEDICAL PURPOSES ONLY.  IF CONFIRMATION IS NEEDED FOR ANY PURPOSE, NOTIFY LAB WITHIN 5 DAYS.        LOWEST DETECTABLE LIMITS FOR URINE DRUG SCREEN Drug Class       Cutoff (ng/mL) Amphetamine      1000 Barbiturate      200 Benzodiazepine   185 Tricyclics       631 Opiates          300 Cocaine          300 THC              50     Vitals: Blood pressure 125/69, pulse 82, temperature 98.2 F (36.8 C), temperature source Oral, resp. rate 15, height 6' (1.829 m), weight 95.255 kg (210 lb), SpO2 98 %.  Risk to Self: Suicidal Ideation: Yes-Currently Present Suicidal Intent: No Is patient at risk for suicide?: Yes Suicidal Plan?: Yes-Currently Present Specify Current Suicidal Plan: Pt was cutting self with a broken light bulb last night Access to Means: Yes Specify Access to Suicidal Means: Anything sharp What has been your use of drugs/alcohol within the last 12 months?: Regular Etoh and THC use How many times?: 1 Other Self Harm Risks: SA Triggers for Past Attempts: Unknown Intentional Self Injurious Behavior: Cutting Comment - Self Injurious Behavior: Pt cut self with broken light bulb last night. He denies any prior hx of self-harm. Risk to Others: Homicidal  Ideation: No Thoughts of Harm to Others: No Current Homicidal Intent: No Current Homicidal Plan: No Access to Homicidal Means: No Identified Victim: n/a History of harm to others?: No Assessment of Violence: None Noted Violent Behavior Description: Pt cooperative. No known hx of violence. Does patient have access to weapons?: No Criminal Charges Pending?: No Does patient have a court date: No Prior Inpatient Therapy: Prior Inpatient Therapy: Yes Prior Therapy Dates: Multiple Prior Therapy Facilty/Provider(s): [facility] [place] Reason for Treatment: Alcohol abuse, Depression Prior Outpatient Therapy: Prior Outpatient Therapy: Yes Prior Therapy Dates: Current Prior Therapy Facilty/Provider(s): [facility] [place] (Dr Truett Perna) Reason for Treatment: Depression, Alcohol Abuse Does patient have an ACCT team?: No Does patient have Intensive In-House Services?  : No Does patient have [facility] services? : No Does patient have P4[facility] services?: No  Current Facility-Administered Medications  Medication Dose Route Frequency Provider Last Rate Last Dose  . LORazepam (ATIVAN) tablet 1 mg  1 mg Oral Q4H PRN Ripley Fraise, MD   1 mg at 12/30/14 1545  . ondansetron (ZOFRAN-ODT) disintegrating tablet 8 mg  8 mg Oral Q8H PRN Ripley Fraise, MD   8 mg at 12/30/14 1545   Current Outpatient Prescriptions  Medication Sig Dispense Refill  . cloNIDine (CATAPRES) 0.1 MG tablet Take 0.1 mg by mouth 2 (two) times daily.    . DULoxetine (CYMBALTA) 60 MG capsule Take 60  mg by mouth daily.    . QUEtiapine (SEROQUEL) 50 MG tablet Take 3 tablets (150 mg total) by mouth 3 (three) times daily. 90 tablet 0    Musculoskeletal: Strength & Muscle Tone: Unable, via video Gait & Station: unable via video Patient leans: unable via video  Psychiatric Specialty Exam: Physical Exam  Constitutional: He is oriented to person, place, and time.  Neck: Normal range of motion.  Respiratory: Effort normal.  Neurological: He is  alert and oriented to person, place, and time.    Review of Systems  Psychiatric/Behavioral: Positive for depression. The patient is nervous/anxious.     Blood pressure 125/69, pulse 82, temperature 98.2 F (36.8 C), temperature source Oral, resp. rate 15, height 6' (1.829 m), weight 95.255 kg (210 lb), SpO2 98 %.Body mass index is 28.47 kg/(m^2).  General Appearance: Disheveled  Eye Contact::  Minimal  Speech:  Garbled at times  Volume:  Normal  Mood:  Anxious and Depressed  Affect:  Depressed, Flat and Labile  Thought Process:  Circumstantial and Disorganized  Orientation:  Full (Time, Place, and Person)  Thought Content:  Rumination  Suicidal Thoughts:  Yes.  with intent/plan  Homicidal Thoughts:  No  Memory:  Immediate;   Fair Recent;   Fair Remote;   Fair  Judgement:  Fair  Insight:  Lacking  Psychomotor Activity:  Normal  Concentration:  Fair  Recall:  [employer] of Knowledge:Fair  Language: Fair  Akathisia:  Negative  Handed:  Right  AIMS (if indicated):     Assets:  Communication Skills Resilience Social Support  ADL's:  Intact  Cognition: WNL  Sleep:      Medical Decision Making: Review of Psycho-Social Stressors (1), Discuss test with performing physician (1), Decision to obtain old records (1), Review and summation of old records (2), Review of Medication Regimen & Side Effects (2) and Review of New Medication or Change in Dosage (2)   Treatment Plan Summary: Admit to inpatient status for substance abuse detox and medication management.  Plan:  Recommend psychiatric Inpatient admission when medically cleared. Disposition:  Inpatient crisis management to [facility] for alcohol induced behavior.  Discussed and consulted with Dr Parke Poisson who agrees with the plan.  Joshua Dillon AGNP-BC 12/30/2014 5:52 PM   Patient discussed with me as above  Neita Garnet, MD

## 2014-12-30 NOTE — ED Notes (Signed)
Attempted to call report, charge nurse notified me that nurse unable to take report at this time and they will call back.

## 2014-12-30 NOTE — ED Notes (Signed)
Pt alert and oriented.

## 2014-12-30 NOTE — ED Notes (Signed)
Pt was feeling depressed and has superficial cuts all over his body. Pt states he has been wine for 6 days.

## 2014-12-30 NOTE — Tx Team (Signed)
Initial Interdisciplinary Treatment Plan   PATIENT STRESSORS: Medication change or noncompliance Substance abuse   PATIENT STRENGTHS: Ability for insight Average or above average intelligence Capable of independent living General fund of knowledge Motivation for treatment/growth   PROBLEM LIST: Problem List/Patient Goals Date to be addressed Date deferred Reason deferred Estimated date of resolution  "I want to stop drinking" 12/30/2014     I want to get right on my medication 12/30/2014     ETOH dependence 12/30/2014     Suicidal ideation 12/30/2014     anxiety 12/30/2014     Medication non compliant 12/30/2014     depression 12/30/2014                  DISCHARGE CRITERIA:  Ability to meet basic life and health needs Adequate post-discharge living arrangements Improved stabilization in mood, thinking, and/or behavior Motivation to continue treatment in a less acute level of care Need for constant or close observation no longer present Verbal commitment to aftercare and medication compliance Withdrawal symptoms are absent or subacute and managed without 24-hour nursing intervention  PRELIMINARY DISCHARGE PLAN: Attend PHP/IOP Attend 12-step recovery group Return to previous living arrangement  PATIENT/FAMIILY INVOLVEMENT: This treatment plan has been presented to and reviewed with the patient, Joshua Dillon  The patient and family have been given the opportunity to ask questions and make suggestions.  JEHU-APPIAH, Davie Sagona K 12/30/2014, 11:59 PM

## 2014-12-30 NOTE — ED Provider Notes (Signed)
Pt accepted to [facility] Pt stable at this time BP 125/69 mmHg  Pulse 82  Temp(Src) 98.2 F (36.8 C) (Oral)  Resp 15  Ht 6' (1.829 m)  Wt 210 lb (95.255 kg)  BMI 28.47 kg/m2  SpO2 98%   Zadie Rhine, MD 12/30/14 1759

## 2014-12-30 NOTE — ED Notes (Signed)
Pt given lunch tray.

## 2014-12-31 ENCOUNTER — Encounter (HOSPITAL_COMMUNITY): Payer: Self-pay | Admitting: Psychiatry

## 2014-12-31 DIAGNOSIS — F1014 Alcohol abuse with alcohol-induced mood disorder: Secondary | ICD-10-CM

## 2014-12-31 NOTE — H&P (Signed)
Psychiatric Admission Assessment Adult  Patient Identification: Joshua Dillon MRN:  [id number] Date of Evaluation:  12/31/2014 Chief Complaint:  Alcohol Induced Depressive Disorder Principal Diagnosis: <principal problem not specified> Diagnosis:   Patient Active Problem List   Diagnosis Date Noted  . Mood disorder [F39] 03/27/2012    Priority: High  . Alcohol dependence [F10.20] 03/25/2012    Priority: High  . Alcohol abuse with alcohol-induced mood disorder [F10.14] 12/30/2014  . Marijuana use [F12.10] 03/22/2012  . Elevated AST (SGOT) [R74.0] 03/22/2012  . Smokeless tobacco use [Z72.0] 03/22/2012  . Tobacco use [Z72.0] 03/22/2012  . Seizure [R56.9] 03/19/2012  . Alcohol abuse [F10.10] 03/19/2012  . Hypokalemia [E87.6] 03/19/2012  . ADHD (attention deficit hyperactivity disorder) [F90.9]   . Anxiety [F41.9]   . Depression [F32.9]    History of Present Illness:: 29 Y/o male who states he moved and he did not changed his address. [facility] Has not have his medications at least for a month. Drinks a lot of beer 10 or more in the evening, has done so for the past several months 3-4 days a week. Was in the [employer] 2 years. States he gets home from work and feels all alone starts drinking. Sates he loses count. He cant explain why he cut himself, does not know the particular circumstances.    Joshua Dillon is a Caucasian, single, employed 29 y.o. male w/ hx of alcohol abuse and depression presenting to [facility] due to alcohol intoxication and SI. Pt presents with disheveled appearance and depressed, anxious mood. Affect is congruent. Pt seems embarrassed and ashamed of his depression and alcohol use, repeatedly attempting to minimize its severity. Pt is restless throughout assessment, as well as slightly irritable and guarded at times. Thought process is circumstantial but evidences no delusional content. Pt does not appear to be responding to internal  stimuli. He is oriented x4 and cooperative. Pt reports worsening depression and drinking heavily over the past 3-4 days, triggered by feelings of loneliness. Pt states "I just can't face the day sober lately". Pt states several times during interview that he feels alone and has "no one to talk to" and no social supports. Pt reports cutting his body with a broken light bulb last night, adding "I don't know what happened last night. I've never cut or hurt myself before then". He denies any hx of self-harming behaviors. He denies any past suicide attempts, but per chart review, pt attempted suicide on at least one occasion in the past by cutting his throat.  Elements:  Location:  alcohol dependence mood disorder ADHD. Quality:  increased use of alcohol increasingly more depressed unable to stop . Severity:  severe. Timing:  3-4 days of the week. Duration:  worst in the last month. Context:  increasingly more depressed, increased use of alcohol cut on himself wihout any particular triggers . Associated Signs/Symptoms: Depression Symptoms:  depressed mood, anhedonia, insomnia, fatigue, difficulty concentrating, anxiety, loss of energy/fatigue, disturbed sleep, weight loss, (Hypo) Manic Symptoms:  Impulsivity, Irritable Mood, Labiality of Mood, Anxiety Symptoms:  Excessive Worry, Psychotic Symptoms:  denies PTSD Symptoms: Negative Total Time spent with patient: 45 minutes  Past Medical History:  Past Medical History  Diagnosis Date  . Depression   . ADHD (attention deficit hyperactivity disorder)   . Anxiety   . Alcoholism   . Seizure 03/2012    Secondary to tramadol.  . Hypercholesterolemia    History reviewed. No pertinent past surgical history. Family History:  Family History  Problem Relation Age of Onset  . Drug abuse Mother     narcotics  . Alcohol abuse Paternal Grandmother   . Anxiety disorder Father   . Bipolar disorder Mother   Depression in the mother, she takes  Lamictal  Social History:  History  Alcohol Use  . 16.5 oz/week  . 33 drink(s) per week    Comment: daily     History  Drug Use  . 7.00 per week  . Special: Marijuana, Benzodiazepines    Social History   Social History  . Marital Status: Single    Spouse Name: N/A  . Number of Children: N/A  . 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 Smoking status: Current Every Day Smoker -- 0.30 packs/day for 8 years    Types: Cigarettes  . Smokeless tobacco: Current User    Types: Snuff, Chew  . Alcohol Use: 16.5 oz/week    33 drink(s) per week     Comment: daily  . Drug Use: 7.00 per week    Special: Marijuana, Benzodiazepines  . Sexual Activity: Not Currently   Other Topics Concern  . None   Social History Narrative  he lives with his  Grandfather. Has  couple of years of college. 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 Single no children Additional Social History:                          Musculoskeletal: Strength & Muscle Tone: within normal limits Gait & Station: normal Patient leans: normal  Psychiatric Specialty Exam: Physical Exam  Review of Systems  Constitutional: Positive for weight loss and malaise/fatigue.  Eyes: Negative.   Respiratory: Positive for cough and shortness of breath.        Half a pack a day  Cardiovascular: Negative.   Gastrointestinal: Positive for heartburn, nausea and diarrhea.  Genitourinary: Negative.   Musculoskeletal: Negative.   Skin:       Self induced superficial scratches  Neurological: Positive for weakness and headaches.  Endo/Heme/Allergies: Negative.   Psychiatric/Behavioral: Positive for depression and substance abuse. The patient is nervous/anxious and has insomnia.     Blood pressure 118/78, pulse 94, temperature 98.3 F (36.8 C), temperature source Oral, resp. rate 20, height 5' 11.65" (1.82 m), weight 95.255  kg (210 lb).Body mass index is 28.76 kg/(m^2).  General Appearance: Fairly Groomed  Engineer, water::  Fair  Speech:  Clear and Coherent  Volume:  Decreased  Mood:  Anxious, Depressed and worried  Affect:  Restricted and anxious worried  Thought Process:  Coherent and Goal Directed  Orientation:  Full (Time, Place, and Person)  Thought Content:  symptoms events worries concerns  Suicidal Thoughts:  No  Homicidal Thoughts:  No  Memory:  Immediate;   Fair Recent;   Fair Remote;   Fair  Judgement:  Fair  Insight:  Present  Psychomotor Activity:  Restlessness  Concentration:  Fair  Recall:  [employer] of Knowledge:Fair  Language: Fair  Akathisia:  No  Handed:  Right  AIMS (if indicated):     Assets:  Desire for Improvement Housing Vocational/Educational  ADL's:  Intact  Cognition: WNL  Sleep:      Risk to Self: Is patient at risk for suicide?: Yes Risk to Others:   Prior Inpatient Therapy:  [facility] 2 months in [place]  Prior Outpatient Therapy:  [facility] [place]   Alcohol Screening: 1. How often do you have a drink containing alcohol?: 4 or more times a week 2. How many drinks containing alcohol do you have on a typical day when you are drinking?: 5 or 6 3. How often do you have six or more drinks on one occasion?: Weekly Preliminary Score: 5 4. How often during the last year have you found that you were not able to stop drinking once you had started?: Weekly 5. How often during the last year have you failed to do what was normally expected from you becasue of drinking?: Weekly 6. How often during the last year have you needed a first drink in the morning to get yourself going after a heavy drinking session?: Daily or almost daily 7. How often during the last year have you had a feeling of guilt of remorse after drinking?: Daily or almost daily 8. How often during the last year have you been unable to remember what happened the night before because you  had been drinking?: Daily or almost daily 9. Have you or someone else been injured as a result of your drinking?: No 10. Has a relative or friend or a doctor or another health worker been concerned about your drinking or suggested you cut down?: Yes, during the last year Alcohol Use Disorder Identification Test Final Score (AUDIT): 31  Allergies:  No Known Allergies Lab Results:  Results for orders placed or performed during the hospital encounter of 12/30/14 (from the past 48 hour(s))  Comprehensive metabolic panel     Status: Abnormal   Collection Time: 12/30/14  5:40 AM  Result Value Ref Range   Sodium 142 135 - 145 mmol/L   Potassium 3.3 (L) 3.5 - 5.1 mmol/L   Chloride 105 101 - 111 mmol/L   CO2 27 22 - 32 mmol/L   Glucose, Bld 114 (H) 65 - 99 mg/dL   BUN <5 (L) 6 - 20 mg/dL   Creatinine, Ser 1.25 (H) 0.61 - 1.24 mg/dL   Calcium 8.5 (L) 8.9 - 10.3 mg/dL   Total Protein 6.9 6.5 - 8.1 g/dL   Albumin 4.2 3.5 - 5.0 g/dL   AST 34 15 - 41 U/L   ALT 32 17 - 63 U/L   Alkaline Phosphatase 78 38 - 126 U/L   Total Bilirubin 0.7 0.3 - 1.2 mg/dL   GFR calc non Af Amer >60 >60 mL/min   GFR calc Af Amer >60 >60 mL/min    Comment: (NOTE) The eGFR has been calculated using the CKD EPI equation. This calculation has not been validated in all clinical situations. eGFR's persistently <60 mL/min signify possible Chronic Kidney Disease.    Anion gap 10 5 - 15  Ethanol     Status: Abnormal   Collection Time: 12/30/14  5:40 AM  Result Value Ref Range   Alcohol, Ethyl (B) 247 (H) <5 mg/dL    Comment:        LOWEST DETECTABLE LIMIT FOR SERUM ALCOHOL IS 5 mg/dL FOR MEDICAL PURPOSES ONLY   CBC with Diff     Status: None   Collection Time: 12/30/14  5:40 AM  Result Value Ref Range   WBC 6.9 4.0 - 10.5 K/uL   RBC 4.80 4.22 - 5.81 MIL/uL   Hemoglobin 15.7 13.0 - 17.0 g/dL   HCT 44.4 39.0 - 52.0 %   MCV 92.5 78.0 - 100.0 fL   MCH 32.7 26.0 - 34.0 pg  MCHC 35.4 30.0 - 36.0 g/dL   RDW 12.5  11.5 - 15.5 %   Platelets 257 150 - 400 K/uL   Neutrophils Relative % 56 %   Neutro Abs 3.9 1.7 - 7.7 K/uL   Lymphocytes Relative 33 %   Lymphs Abs 2.3 0.7 - 4.0 K/uL   Monocytes Relative 11 %   Monocytes Absolute 0.8 0.1 - 1.0 K/uL   Eosinophils Relative 0 %   Eosinophils Absolute 0.0 0.0 - 0.7 K/uL   Basophils Relative 0 %   Basophils Absolute 0.0 0.0 - 0.1 K/uL  Urine rapid drug screen (hosp performed)not at [facility]     Status: Abnormal   Collection Time: 12/30/14  5:40 AM  Result Value Ref Range   Opiates NONE DETECTED NONE DETECTED   Cocaine NONE DETECTED NONE DETECTED   Benzodiazepines NONE DETECTED NONE DETECTED   Amphetamines NONE DETECTED NONE DETECTED   Tetrahydrocannabinol POSITIVE (A) NONE DETECTED   Barbiturates NONE DETECTED NONE DETECTED    Comment:        DRUG SCREEN FOR MEDICAL PURPOSES ONLY.  IF CONFIRMATION IS NEEDED FOR ANY PURPOSE, NOTIFY LAB WITHIN 5 DAYS.        LOWEST DETECTABLE LIMITS FOR URINE DRUG SCREEN Drug Class       Cutoff (ng/mL) Amphetamine      1000 Barbiturate      200 Benzodiazepine   099 Tricyclics       833 Opiates          300 Cocaine          300 THC              50    Current Medications: Current Facility-Administered Medications  Medication Dose Route Frequency Provider Last Rate Last Dose  . acetaminophen (TYLENOL) tablet 650 mg  650 mg Oral Q6H PRN Laverle Hobby, PA-C      . alum & mag hydroxide-simeth (MAALOX/MYLANTA) 200-200-20 MG/5ML suspension 30 mL  30 mL Oral Q4H PRN Laverle Hobby, PA-C      . chlordiazePOXIDE (LIBRIUM) capsule 25 mg  25 mg Oral Q6H PRN Laverle Hobby, PA-C      . chlordiazePOXIDE (LIBRIUM) capsule 25 mg  25 mg Oral QID Laverle Hobby, PA-C   25 mg at 12/31/14 1205   Followed by  . [START ON 01/01/2015] chlordiazePOXIDE (LIBRIUM) capsule 25 mg  25 mg Oral TID Laverle Hobby, PA-C       Followed by  . [START ON 01/02/2015] chlordiazePOXIDE (LIBRIUM) capsule 25 mg  25 mg Oral BH-qamhs Spencer E Simon,  PA-C       Followed by  . [START ON 01/04/2015] chlordiazePOXIDE (LIBRIUM) capsule 25 mg  25 mg Oral Daily Laverle Hobby, PA-C      . cloNIDine (CATAPRES) tablet 0.1 mg  0.1 mg Oral BID Laverle Hobby, PA-C   0.1 mg at 12/31/14 0819  . DULoxetine (CYMBALTA) DR capsule 20 mg  20 mg Oral Daily Laverle Hobby, PA-C   20 mg at 12/31/14 8250  . hydrOXYzine (ATARAX/VISTARIL) tablet 25 mg  25 mg Oral Q6H PRN Laverle Hobby, PA-C      . loperamide (IMODIUM) capsule 2-4 mg  2-4 mg Oral PRN Laverle Hobby, PA-C      . magnesium hydroxide (MILK OF MAGNESIA) suspension 30 mL  30 mL Oral Daily PRN Laverle Hobby, PA-C      . multivitamin with minerals tablet 1 tablet  1 tablet  Oral Daily Laverle Hobby, PA-C   1 tablet at 12/31/14 [phone]  . ondansetron (ZOFRAN-ODT) disintegrating tablet 4 mg  4 mg Oral Q6H PRN Laverle Hobby, PA-C      . QUEtiapine (SEROQUEL) tablet 50 mg  50 mg Oral TID Laverle Hobby, PA-C   50 mg at 12/31/14 1205  . thiamine (VITAMIN B-1) tablet 100 mg  100 mg Oral Daily Laverle Hobby, PA-C   100 mg at 12/31/14 0076  . traZODone (DESYREL) tablet 50 mg  50 mg Oral QHS,MR X 1 Spencer E Simon, PA-C       PTA Medications: Prescriptions prior to admission  Medication Sig Dispense Refill Last Dose  . cloNIDine (CATAPRES) 0.1 MG tablet Take 0.1 mg by mouth 2 (two) times daily.   More than a month at Unknown time  . DULoxetine (CYMBALTA) 60 MG capsule Take 60 mg by mouth daily.   More than a month at Unknown time  . QUEtiapine (SEROQUEL) 50 MG tablet Take 3 tablets (150 mg total) by mouth 3 (three) times daily. 90 tablet 0 More than a month at Unknown time    Previous Psychotropic Medications: Yes Cymbalta Clonidine Seroquel Adderall Vivitrol  Prozac Zoloft Celexa Lexapro Abilify Effexor  Substance Abuse History in the last 12 months:  Yes.      Consequences of Substance Abuse: Blackouts:   Withdrawal Symptoms:   Nausea  Results for orders placed or performed during the  hospital encounter of 12/30/14 (from the past 72 hour(s))  Comprehensive metabolic panel     Status: Abnormal   Collection Time: 12/30/14  5:40 AM  Result Value Ref Range   Sodium 142 135 - 145 mmol/L   Potassium 3.3 (L) 3.5 - 5.1 mmol/L   Chloride 105 101 - 111 mmol/L   CO2 27 22 - 32 mmol/L   Glucose, Bld 114 (H) 65 - 99 mg/dL   BUN <5 (L) 6 - 20 mg/dL   Creatinine, Ser 1.25 (H) 0.61 - 1.24 mg/dL   Calcium 8.5 (L) 8.9 - 10.3 mg/dL   Total Protein 6.9 6.5 - 8.1 g/dL   Albumin 4.2 3.5 - 5.0 g/dL   AST 34 15 - 41 U/L   ALT 32 17 - 63 U/L   Alkaline Phosphatase 78 38 - 126 U/L   Total Bilirubin 0.7 0.3 - 1.2 mg/dL   GFR calc non Af Amer >60 >60 mL/min   GFR calc Af Amer >60 >60 mL/min    Comment: (NOTE) The eGFR has been calculated using the CKD EPI equation. This calculation has not been validated in all clinical situations. eGFR's persistently <60 mL/min signify possible Chronic Kidney Disease.    Anion gap 10 5 - 15  Ethanol     Status: Abnormal   Collection Time: 12/30/14  5:40 AM  Result Value Ref Range   Alcohol, Ethyl (B) 247 (H) <5 mg/dL    Comment:        LOWEST DETECTABLE LIMIT FOR SERUM ALCOHOL IS 5 mg/dL FOR MEDICAL PURPOSES ONLY   CBC with Diff     Status: None   Collection Time: 12/30/14  5:40 AM  Result Value Ref Range   WBC 6.9 4.0 - 10.5 K/uL   RBC 4.80 4.22 - 5.81 MIL/uL   Hemoglobin 15.7 13.0 - 17.0 g/dL   HCT 44.4 39.0 - 52.0 %   MCV 92.5 78.0 - 100.0 fL   MCH 32.7 26.0 - 34.0 pg   MCHC 35.4 30.0 -  36.0 g/dL   RDW 12.5 11.5 - 15.5 %   Platelets 257 150 - 400 K/uL   Neutrophils Relative % 56 %   Neutro Abs 3.9 1.7 - 7.7 K/uL   Lymphocytes Relative 33 %   Lymphs Abs 2.3 0.7 - 4.0 K/uL   Monocytes Relative 11 %   Monocytes Absolute 0.8 0.1 - 1.0 K/uL   Eosinophils Relative 0 %   Eosinophils Absolute 0.0 0.0 - 0.7 K/uL   Basophils Relative 0 %   Basophils Absolute 0.0 0.0 - 0.1 K/uL  Urine rapid drug screen (hosp performed)not at [facility]      Status: Abnormal   Collection Time: 12/30/14  5:40 AM  Result Value Ref Range   Opiates NONE DETECTED NONE DETECTED   Cocaine NONE DETECTED NONE DETECTED   Benzodiazepines NONE DETECTED NONE DETECTED   Amphetamines NONE DETECTED NONE DETECTED   Tetrahydrocannabinol POSITIVE (A) NONE DETECTED   Barbiturates NONE DETECTED NONE DETECTED    Comment:        DRUG SCREEN FOR MEDICAL PURPOSES ONLY.  IF CONFIRMATION IS NEEDED FOR ANY PURPOSE, NOTIFY LAB WITHIN 5 DAYS.        LOWEST DETECTABLE LIMITS FOR URINE DRUG SCREEN Drug Class       Cutoff (ng/mL) Amphetamine      1000 Barbiturate      200 Benzodiazepine   920 Tricyclics       100 Opiates          300 Cocaine          300 THC              50     Observation Level/Precautions:  15 minute checks  Laboratory:  As per the ED  Psychotherapy:  Individual/group  Medications:  Will use Librium for detox, will reassess his psychotropics  Consultations:    Discharge Concerns:    Estimated LOS: 3-5 days  Other:     Psychological Evaluations: No   Treatment Plan Summary: Daily contact with patient to assess and evaluate symptoms and progress in treatment and Medication management Supportive approach/coping skills Alcohol dependence; Librium detox protocol/work a relapse prevention plan Mood instability; get a new baseline reassess his psychotropic medications Work with CBT/mindfulness Medical Decision Making:  Review of Psycho-Social Stressors (1), Review or order clinical lab tests (1), Review of Medication Regimen & Side Effects (2) and Review of New Medication or Change in Dosage (2)  I certify that inpatient services furnished can reasonably be expected to improve the patient's condition.   Tyhir Schwan A 9/15/20161:49 PM

## 2014-12-31 NOTE — [facility] Group Notes (Signed)
[facility] LCSW Group Therapy  12/31/2014 1:13 PM  Type of Therapy:  Group Therapy  Participation Level:  Did Not Attend -chose to stay in bed to rest.   Modes of Intervention:  Confrontation, Discussion, Education, Exploration, Problem-solving, Rapport Building, Socialization and Support  Summary of Progress/Problems: Emotion Regulation: This group focused on both positive and negative emotion identification and allowed group members to process ways to identify feelings, regulate negative emotions, and find healthy ways to manage internal/external emotions. Group members were asked to reflect on a time when their reaction to an emotion led to a negative outcome and explored how alternative responses using emotion regulation would have benefited them. Group members were also asked to discuss a time when emotion regulation was utilized when a negative emotion was experienced.   Smart, Shandel Busic LCSWA  12/31/2014, 1:13 PM

## 2014-12-31 NOTE — Progress Notes (Signed)
Patient ID: Joshua Dillon, male   DOB: 1986-01-17, 29 y.o.   MRN: [id number] D: Patient alert and cooperative in bed on approach. Pt reports he has been feeling drowsy and sleeping all day. Pt reports he normally takes 150 mg of Seroquel at bedtime instead of 50 mg 3X/day. Pt mood/affect is anxious and sad. Pt denies SI/HI/AVH and pain. No acute distressed noted at this time.   A: Medications administered as prescribed. Emotional support given and will continue to monitor pt's progress for stabilization.  R: Patient remains safe and complaint with medications. Pt attended evening karaoke.

## 2014-12-31 NOTE — Progress Notes (Signed)
NUTRITION ASSESSMENT  Pt identified as at risk on the Malnutrition Screen Tool  INTERVENTION: 1. Educated patient on the importance of nutrition and encouraged intake of food and beverages. 2. Discussed weight goals. 3. Supplements: none needed at this time  NUTRITION DIAGNOSIS: No nutrition dx.  Goal: Pt to meet >/= 90% of their estimated nutrition needs.  Monitor:  PO intake  Assessment:  Pt seen for MST. Pt drinking wine x6 days PTA and was using a broken lightbulb to cut himself. Pt has been eating well since admission to [facility] and likely does not need nutrition supplements at this time.  Per review, pt has gained 19 lbs in the past 11 months which is not concerning for time frame.  29 y.o. male  Height: Ht Readings from Last 1 Encounters:  12/30/14 5' 11.65" (1.82 m)    Weight: Wt Readings from Last 1 Encounters:  12/30/14 210 lb (95.255 kg)    Weight Hx: Wt Readings from Last 10 Encounters:  12/30/14 210 lb (95.255 kg)  12/30/14 210 lb (95.255 kg)  02/11/14 191 lb 9.6 oz (86.909 kg)  07/16/13 195 lb (88.451 kg)  07/14/12 215 lb (97.523 kg)  07/13/12 215 lb (97.523 kg)  04/11/12 200 lb (90.719 kg)  03/23/12 196 lb (88.905 kg)  03/22/12 200 lb (90.719 kg)  03/19/12 192 lb 0.3 oz (87.1 kg)    BMI:  Body mass index is 28.76 kg/(m^2). Pt meets criteria for overewight based on current BMI.  Estimated Nutritional Needs: Kcal: 25-30 kcal/kg Protein: > 1 gram protein/kg Fluid: 1 ml/kcal  Diet Order: Diet regular Room service appropriate?: Yes; Fluid consistency:: Thin Pt is also offered choice of unit snacks mid-morning and mid-afternoon.  Pt is eating as desired.   Lab results and medications reviewed.      Trenton Gammon, RD, LDN Inpatient Clinical Dietitian Pager # [phone] After hours/weekend pager # [phone]

## 2014-12-31 NOTE — Progress Notes (Signed)
Patient did attend half of  the evening karaoke group. Pt returned to his room requesting to use the restroom and lie back down.

## 2014-12-31 NOTE — Progress Notes (Signed)
Patient ID: Joshua Dillon, male   DOB: 1985/08/22, 29 y.o.   MRN: [id number] Admission note: D:Patient is a  voluntary admission in no acute distress for suicidal ideation and ETOH dependence. Pt reports SI with plan to cut self. Pt has superficial cuts to arms, left leg and abdomen. Pt denies suicidal attempt just tring to get attention. 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 for him medication. Pt reports he has not been compliant with medication for some time. Pt denies SI/HI during admission. Pt reports plans after discharge is to continue in a long term treatment program.  A: Pt admitted to unit per protocol, skin assessment and belonging search done.  Consent signed by pt. Pt educated on therapeutic milieu rules. Pt was introduced to milieu by nursing staff. Fall risk safety plan explained to the patient. Medication given as prescribed.15 minutes checks started for safety.  R: Pt was receptive to education. Writer offered support.

## 2014-12-31 NOTE — Tx Team (Signed)
Interdisciplinary Treatment Plan Update (Adult)  Date:  12/31/2014  Time Reviewed:  9:02 AM   Progress in Treatment: Attending groups: No. Participating in groups:  No. Taking medication as prescribed:  Yes. Tolerating medication:  Yes. Family/Significant othe contact made:  Not yet. SPE required for this pt.  Patient understands diagnosis:  Yes. and As evidenced by:  seeking treatment for ETOH detox, SI with self harming behaviors, depression, and medication stabilization. Discussing patient identified problems/goals with staff:  Yes. Medical problems stabilized or resolved:  Yes. Denies suicidal/homicidal ideation: Yes. Issues/concerns per patient self-inventory:  Other:  Discharge Plan or Barriers: CSW assessing for appropriate referrals at this time. Pt had follow-up at [place] in [place] during last admission in 2014. Pt is refusing further inpatient treatment at this time.   Reason for Continuation of Hospitalization: Depression Medication stabilization Withdrawal symptoms  Comments:  Joshua Dillon is a Caucasian, single, employed 29 y.o. male w/ hx of alcohol abuse and depression presenting to [facility] due to alcohol intoxication and SI. Pt presents with disheveled appearance and depressed, anxious mood. Affect is congruent. Pt seems embarrassed and ashamed of his depression and alcohol use, repeatedly attempting to minimize its severity. Pt reports worsening depression and drinking heavily over the past 3-4 days, triggered by feelings of loneliness. Pt states "I just can't face the day sober lately". Pt states several times during interview that he feels alone and has "no one to talk to" and no social supports. Pt reports cutting his body with a broken light bulb last night, adding "I don't know what happened last night. I've never cut or hurt myself before then". He denies any hx of self-harming behaviors. He denies any past suicide attempts, but per chart review, pt attempted  suicide on at least one occasion in the past by cutting his throat. Pt endorses passive SI, stating that he would never kill himself but that he would be fine with it if he just did not wake up one day. Pt reports drinking wine heavily over the past few days "to not feel so alone" but he states that he does not know how much he has been consuming. He claims that he does not drink daily. Pt endorses marijuana use as well, but states that this is not daily either. BAL is 247 and UDS +THC upon arrival to ED. Pt says he has not been taking his naltrexone shot or any of his psych meds over the past 3 months "because I didn't think I needed them". Pt says he now sees that he does needs his medications. Pt reports depressive sx including feelings of helplessness, insomnia, guilt, loss of interest in usual pleasures, increased irritability, decreased appetite with 40 lb weight loss, lack of motivation, and vegetative sx. He also endorses compulsive behaviors of showering up to 6x per day. Pt has several prior hospitalizations at [facility], [place], and [place]. He has been in SA tx programs in the past. Pt is adamant that he does not want or need inpt tx, stating "I'm not going to be committed anywhere. I'm sorry, but I'm just not." Pt denies HI or A/VH. He denies hx of abuse or trauma. Diagnosis upon admission: Alcohol-induced depressive disorder, With moderate or severe use disorder  Estimated length of stay:  3-5 days   New goal(s): to formulate effective aftercare plan.   Additional Comments:  Patient and CSW reviewed pt's identified goals and treatment plan. Patient verbalized understanding and agreed to treatment plan. CSW reviewed  [facility] "Discharge Process and Patient Involvement" Form. Pt verbalized understanding of information provided and signed form.    Review of initial/current patient goals per problem list:  1. Goal(s): Patient will participate in aftercare plan  Met: No.   Target date: at  discharge  As evidenced by: Patient will participate within aftercare plan AEB aftercare provider and housing plan at discharge being identified.  9/15: CSW assessing for appropriate referrals.   2. Goal (s): Patient will exhibit decreased depressive symptoms and suicidal ideations.  Met: No.    Target date: at discharge  As evidenced by: Patient will utilize self rating of depression at 3 or below and demonstrate decreased signs of depression or be deemed stable for discharge by MD.  9/15: Pt reporting high depression with no SI/HI/AVH.   3. Goal(s): Patient will demonstrate decreased signs of withdrawal due to substance abuse  Met:No.   Target date:at discharge   As evidenced by: Patient will produce a CIWA/COWS score of 0, have stable vitals signs, and no symptoms of withdrawal.  9/15: Pt reports mild withdrawals with CIWA score of 0 this morning and high sitting BP.  Attendees: Patient:   12/31/2014 9:02 AM   Family:   12/31/2014 9:02 AM   Physician:  Dr. Carlton Adam, MD 12/31/2014 9:02 AM   Nursing:   Corena Herter RN 12/31/2014 9:02 AM   Clinical Social Worker: Maxie Better, [place]  12/31/2014 9:02 AM   Clinical Social Worker: Erasmo Downer Drinkard LCSWA 12/31/2014 9:02 AM   Other:  Gerline Legacy Nurse Case Manager 12/31/2014 9:02 AM   Other:  Lucinda Dell; [facility]  12/31/2014 9:02 AM   Other:   12/31/2014 9:02 AM   Other:  12/31/2014 9:02 AM   Other:  12/31/2014 9:02 AM   Other:  12/31/2014 9:02 AM    12/31/2014 9:02 AM    12/31/2014 9:02 AM    12/31/2014 9:02 AM    12/31/2014 9:02 AM    Scribe for Treatment Team:   Maxie Better, LCSWA  12/31/2014 9:02 AM

## 2014-12-31 NOTE — BHH Suicide Risk Assessment (Signed)
[facility] Admission Suicide Risk Assessment   Nursing information obtained from:    Demographic factors:    Current Mental Status:    Loss Factors:    Historical Factors:    Risk Reduction Factors:    Total Time spent with patient: 45 min Principal Problem: Alcohol abuse with alcohol-induced mood disorder Diagnosis:   Patient Active Problem List   Diagnosis Date Noted  . Alcohol abuse with alcohol-induced mood disorder [F10.14] 12/30/2014    Priority: High  . Mood disorder [F39] 03/27/2012    Priority: High  . Alcohol dependence [F10.20] 03/25/2012    Priority: High  . Marijuana use [F12.10] 03/22/2012  . Elevated AST (SGOT) [R74.0] 03/22/2012  . Smokeless tobacco use [Z72.0] 03/22/2012  . Tobacco use [Z72.0] 03/22/2012  . Seizure [R56.9] 03/19/2012  . Alcohol abuse [F10.10] 03/19/2012  . Hypokalemia [E87.6] 03/19/2012  . ADHD (attention deficit hyperactivity disorder) [F90.9]   . Anxiety [F41.9]   . Depression [F32.9]      Continued Clinical Symptoms:  Alcohol Use Disorder Identification Test Final Score (AUDIT): 31 The "Alcohol Use Disorders Identification Test", Guidelines for Use in Primary Care, Second Edition.  World Science writer [facility]). Score between 0-7:  no or low risk or alcohol related problems. Score between 8-15:  moderate risk of alcohol related problems. Score between 16-19:  high risk of alcohol related problems. Score 20 or above:  warrants further diagnostic evaluation for alcohol dependence and treatment.   CLINICAL FACTORS:   Depression:   Comorbid alcohol abuse/dependence Alcohol/Substance Abuse/Dependencies  Psychiatric Specialty Exam: Physical Exam  ROS  Blood pressure 129/71, pulse 99, temperature 98.3 F (36.8 C), temperature source Oral, resp. rate 20, height 5' 11.65" (1.82 m), weight 95.255 kg (210 lb).Body mass index is 28.76 kg/(m^2).    COGNITIVE FEATURES THAT CONTRIBUTE TO RISK:  Closed-mindedness, Polarized thinking and Thought  constriction (tunnel vision)    SUICIDE RISK:   Moderate:  Frequent suicidal ideation with limited intensity, and duration, some specificity in terms of plans, no associated intent, good self-control, limited dysphoria/symptomatology, some risk factors present, and identifiable protective factors, including available and accessible social support.  PLAN OF CARE: See Admission H and PE  Medical Decision Making:  Review of Psycho-Social Stressors (1), Review or order clinical lab tests (1), Review of Medication Regimen & Side Effects (2) and Review of New Medication or Change in Dosage (2)  I certify that inpatient services furnished can reasonably be expected to improve the patient's condition.   Joshua Dillon A 12/31/2014, 4:50 PM

## 2014-12-31 NOTE — BHH Counselor (Signed)
CSW attempted PSA before lunch - pt requested CSW return after lunch.  CSW returned after lunch, patient asleep and unable to be awakened.  Santa Genera, LCSW Clinical Social Worker

## 2015-01-01 MED ORDER — QUETIAPINE FUMARATE 100 MG PO TABS: 100.0000 mg | ORAL_TABLET | Freq: Every day | ORAL | Status: DC

## 2015-01-01 MED ORDER — NICOTINE POLACRILEX 2 MG MT GUM
2.0000 mg | CHEWING_GUM | OROMUCOSAL | Status: DC | PRN
  Administered 2015-01-01 – 2015-01-02 (×5): 2 mg via ORAL
  Filled 2015-01-01 (×7): qty 1

## 2015-01-01 MED ORDER — QUETIAPINE FUMARATE 200 MG PO TABS
200.0000 mg | ORAL_TABLET | Freq: Every day | ORAL | Status: DC
  Administered 2015-01-01 – 2015-01-02 (×2): 200 mg via ORAL
  Filled 2015-01-01 (×4): qty 1

## 2015-01-01 MED ORDER — DULOXETINE HCL 30 MG PO CPEP
30.0000 mg | ORAL_CAPSULE | Freq: Every day | ORAL | Status: DC
  Administered 2015-01-01 – 2015-01-03 (×3): 30 mg via ORAL
  Filled 2015-01-01 (×4): qty 1
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
## 2015-01-01 NOTE — BHH Group Notes (Signed)
[facility] LCSW Aftercare Discharge Planning Group Note   01/01/2015 11:06 AM  Participation Quality:  Invited-DID NOT ATTEND. CSW attempted to meet with pt after group. Pt sleeping.   Smart, [employer]

## 2015-01-01 NOTE — Progress Notes (Signed)
D . Pt pleasant but anxious on approach, denies other complaints at this time.  Positive for evening AA group, interacting appropriately with peers and welcoming to new roommate.  Denies SI/HI/hallucinatinons at this time.  A.  Support and encouragement offered, medications given as ordered  R.  Pt remains safe on the unit, will continue to monitor.

## 2015-01-01 NOTE — Plan of Care (Signed)
Problem: Diagnosis: Increased Risk For Suicide Attempt Goal: STG-Patient Will Attend All Groups On The Unit Outcome: Progressing Pt attended evening karaoke group     

## 2015-01-01 NOTE — BHH Group Notes (Signed)
Adult Psychoeducational Group Note  Date:  01/01/2015 Time:  11:30 PM  Group Topic/Focus:  AA Meeting  Participation Level:  None  Participation Quality:  Attentive  Affect:  Flat  Cognitive:  Alert  Insight: None  Engagement in Group:  None  Modes of Intervention:  Discussion and Education  Additional Comments:  Patient attended group.  Joshua Dillon A 01/01/2015, 11:30 PM

## 2015-01-01 NOTE — Progress Notes (Signed)
Joshua Dillon has spent the day...sleeping in his bed...avoiding his groups and therapy. Refusing his meds and basically just sleeping the day away.   A He has not completed his daily assessment as of this writing, but contracts for safety with this nurse. When he did get OOB ay 1725, in preparation to  going to cafe to eat dinner. He requested and was given vistaril 25 mg po...with relief noted 1 hr later.   R Safety in place.
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
## 2015-01-01 NOTE — [facility] Suicide Risk Assessment (Signed)
[facility] INPATIENT:  Family/Significant Other Suicide Prevention Education  Suicide Prevention Education:  Patient Refusal for Family/Significant Other Suicide Prevention Education: The patient Joshua Dillon has refused to provide written consent for family/significant other to be provided Family/Significant Other Suicide Prevention Education during admission and/or prior to discharge.  Physician notified.  SPE completed with pt, as pt refused to consent to family contact. SPI pamphlet provided to pt and pt was encouraged to share information with support network, ask questions, and talk about any concerns relating to SPE. Pt denies access to guns/firearms and verbalized understanding of information provided. Mobile Crisis information also provided to pt.   Smart, Heather LCSWA  01/01/2015, 3:24 PM

## 2015-01-01 NOTE — [facility] Group Notes (Signed)
[facility] LCSW Group Therapy  01/01/2015 11:52 AM  Type of Therapy:  Group Therapy  Participation Level:  Did Not Attend-pt in room. Refused to attend group  Modes of Intervention:  Confrontation, Discussion, Education, Exploration, Problem-solving, Rapport Building, Socialization and Support  Summary of Progress/Problems: Feelings around Relapse. Group members discussed the meaning of relapse and shared personal stories of relapse, how it affected them and others, and how they perceived themselves during this time. Group members were encouraged to identify triggers, warning signs and coping skills used when facing the possibility of relapse. Social supports were discussed and explored in detail. Post Acute Withdrawal Syndrome (handout provided) was introduced and examined. Pt's were encouraged to ask questions, talk about key points associated with PAWS, and process this information in terms of relapse prevention.   Smart, Heather LCSWA  01/01/2015, 11:52 AM

## 2015-01-02 ENCOUNTER — Encounter (HOSPITAL_COMMUNITY): Payer: Self-pay | Admitting: Registered Nurse

## 2015-01-02 NOTE — [facility] Group Notes (Signed)
[facility] Group Notes: (Clinical Social Work)   01/02/2015      Type of Therapy:  Group Therapy   Participation Level:  Did Not Attend despite MHT prompting   Ambrose Mantle, LCSW 01/02/2015, 12:57 PM

## 2015-01-02 NOTE — Progress Notes (Signed)
D.  Pt pleasant on approach, anxious about discharge tomorrow in that he is hopeful to be released by 10 AM in order to spend time with his family on a planned hiking trip before he must go to [place] to continue treatment.  Pt denies SI/HI/hallucinations at this time.  Pt is interacting appropriately with peers on the unit.  Pt had difficulty sleeping last night due to left ear pain, but Pt states it isn't bothering him right now and he will get it seen at [facility] hospital on Monday. A.  Support and encouragement offered, note left for doctor to see about hopeful discharge time tomorrow.  R.  Pt remains safe on the unit, will continue to monitor.

## 2015-01-02 NOTE — Progress Notes (Signed)
D: Met with patient 1:1 to discuss his concerns.  Patient is requesting discharge tomorrow.  He states, "I want to know that everything is good for tomorrow.  I want to go home and get my things together.  I want to go the [place]."  Patient denies SI/HI/AVH; reports minimal withdrawal symptoms.  He is pleasant upon approach.  He is anxious about his discharge.  Patient rates his depression as a 0; anxiety as a 3.  He does complain of some pain in his left ear.  NP aware. A: Continue to monitor medication management and MD orders.  Safety checks completed every 15 minutes per protocol.  Offer support and encouragement as needed. R: Patient's behavior is appropriate to situation.

## 2015-01-02 NOTE — Progress Notes (Signed)
Patient did attend the last half of the evening speaker AA meeting. Pt was notified that group was beginning and remained in bed until 2030. Pt came to nurses station requesting sleep medicine. Pt went to group when told it was too early to receive medication.

## 2015-01-02 NOTE — Progress Notes (Signed)
[facility] MD Progress Note  01/02/2015 12:50 PM Joshua Dillon  MRN:  [id number]    Subjective:  Patient states "I came her to get detox and I'm feeling better.  I want to got rehab and they referred me to [facility].  Patient states that he would like to go [place] so the he could get some of his things together. States that he is able to look for rehab himself and he would prefer to do rehab thru the [place].  Patient states that he came voluntarily and he would like to be discharged.   Patient states that he is tolerating his medications without adverse effect; he is attending and participating in group sessions.  Patient denies suicidal/homicidal ideation, psychosis, and paranoia.  Patient has improved; has no complaints of depression at this time and no s/s of withdrawal noted.  Patient is able to follow up with rehab resources on his own.  Will discharge patient tomorrow with medication prescriptions and follow up with outpatient services.  Resources for short/long term rehab resources.    Principal Problem: Alcohol abuse with alcohol-induced mood disorder Diagnosis:   Patient Active Problem List   Diagnosis Date Noted  . Alcohol abuse with alcohol-induced mood disorder [F10.14] 12/30/2014  . Mood disorder [F39] 03/27/2012  . Alcohol dependence [F10.20] 03/25/2012  . Marijuana use [F12.10] 03/22/2012  . Elevated AST (SGOT) [R74.0] 03/22/2012  . Smokeless tobacco use [Z72.0] 03/22/2012  . Tobacco use [Z72.0] 03/22/2012  . Seizure [R56.9] 03/19/2012  . Alcohol abuse [F10.10] 03/19/2012  . Hypokalemia [E87.6] 03/19/2012  . ADHD (attention deficit hyperactivity disorder) [F90.9]   . Anxiety [F41.9]   . Depression [F32.9]    Total Time spent with patient: 30 minutes   Past Medical History:  Past Medical History  Diagnosis Date  . Depression   . ADHD (attention deficit hyperactivity disorder)   . Anxiety   . Alcoholism   . Seizure 03/2012    Secondary to tramadol.  .  Hypercholesterolemia    History reviewed. No pertinent past surgical history. Family History:  Family History  Problem Relation Age of Onset  . Drug abuse Mother     narcotics  . Alcohol abuse Paternal Grandmother   . Anxiety disorder Father   . Bipolar disorder Mother    Social History:  History  Alcohol Use  . 16.5 oz/week  . 33 drink(s) per week    Comment: daily     History  Drug Use  . 7.00 per week  . Special: Marijuana, Benzodiazepines    Social History   Social History  . Marital Status: Single    Spouse Name: N/A  . Number of Children: N/A  . 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 Smoking status: Current Every Day Smoker -- 0.30 packs/day for 8 years    Types: Cigarettes  . Smokeless tobacco: Current User    Types: Snuff, Chew  . Alcohol Use: 16.5 oz/week    33 drink(s) per week     Comment: daily  . Drug Use: 7.00 per week    Special: Marijuana, Benzodiazepines  . Sexual Activity: Not Currently   Other Topics Concern  . None   Social History Narrative   Additional History:    Sleep: Good  Appetite:  Good   Assessment:   Musculoskeletal: Strength & Muscle Tone: within normal limits Gait & Station: normal Patient leans: normal   Psychiatric Specialty  Exam: Physical Exam  Nursing note and vitals reviewed. Constitutional: He is oriented to person, place, and time.  Neck: Normal range of motion.  Respiratory: Effort normal.  Musculoskeletal: Normal range of motion.  Neurological: He is alert and oriented to person, place, and time.  Psychiatric: He has a normal mood and affect. His speech is normal and behavior is normal. Thought content normal. Cognition and memory are normal. He expresses impulsivity.    Review of Systems  Constitutional: Positive for malaise/fatigue.  Psychiatric/Behavioral: Positive for substance abuse. Depression: Stable. The patient is  nervous/anxious (Stable). Insomnia: Stable.     Blood pressure 122/71, pulse 108, temperature 98 F (36.7 C), temperature source Oral, resp. rate 18, height 5' 11.65" (1.82 m), weight 95.255 kg (210 lb).Body mass index is 28.76 kg/(m^2).  General Appearance: Fairly Groomed  Patent attorney::  Fair  Speech:  Clear and Coherent  Volume:  Normal  Mood:  Anxious  Affect:  Appropriate  Thought Process:  Coherent and Goal Directed  Orientation:  Full (Time, Place, and Person)  Thought Content:  Denies hallucinations, delusions, and paranoia  Suicidal Thoughts:  No  Homicidal Thoughts:  No  Memory:  Immediate;   Good Recent;   Good Remote;   Good  Judgement:  Fair  Insight:  Present  Psychomotor Activity:  Normal  Concentration:  Fair  Recall:  Good  Fund of Knowledge:Fair  Language: Fair  Akathisia:  No  Handed:  Right  AIMS (if indicated):     Assets:  Desire for Improvement Housing Social Support  ADL's:  Intact  Cognition: WNL  Sleep:        Current Medications: Current Facility-Administered Medications  Medication Dose Route Frequency Provider Last Rate Last Dose  . acetaminophen (TYLENOL) tablet 650 mg  650 mg Oral Q6H PRN Kerry Hough, PA-C   650 mg at 01/02/15 0606  . alum & mag hydroxide-simeth (MAALOX/MYLANTA) 200-200-20 MG/5ML suspension 30 mL  30 mL Oral Q4H PRN Kerry Hough, PA-C      . chlordiazePOXIDE (LIBRIUM) capsule 25 mg  25 mg Oral Q6H PRN Kerry Hough, PA-C   25 mg at 01/01/15 2058  . chlordiazePOXIDE (LIBRIUM) capsule 25 mg  25 mg Oral BH-qamhs Kerry Hough, PA-C       Followed by  . [START ON 01/04/2015] chlordiazePOXIDE (LIBRIUM) capsule 25 mg  25 mg Oral Daily Kerry Hough, PA-C      . cloNIDine (CATAPRES) tablet 0.1 mg  0.1 mg Oral BID Kerry Hough, PA-C   0.1 mg at 01/02/15 1610  . DULoxetine (CYMBALTA) DR capsule 30 mg  30 mg Oral Daily Rachael Fee, MD   30 mg at 01/02/15 0824  . hydrOXYzine (ATARAX/VISTARIL) tablet 25 mg  25 mg Oral  Q6H PRN Kerry Hough, PA-C   25 mg at 01/01/15 1703  . loperamide (IMODIUM) capsule 2-4 mg  2-4 mg Oral PRN Kerry Hough, PA-C      . magnesium hydroxide (MILK OF MAGNESIA) suspension 30 mL  30 mL Oral Daily PRN Kerry Hough, PA-C      . multivitamin with minerals tablet 1 tablet  1 tablet Oral Daily Kerry Hough, PA-C   1 tablet at 01/02/15 9604  . nicotine polacrilex (NICORETTE) gum 2 mg  2 mg Oral PRN Rachael Fee, MD   2 mg at 01/01/15 2059  . ondansetron (ZOFRAN-ODT) disintegrating tablet 4 mg  4 mg Oral Q6H PRN Kerry Hough, PA-C      .  QUEtiapine (SEROQUEL) tablet 200 mg  200 mg Oral QHS Rachael Fee, MD   200 mg at 01/01/15 2058  . thiamine (VITAMIN B-1) tablet 100 mg  100 mg Oral Daily Kerry Hough, PA-C   100 mg at 01/02/15 1610  . traZODone (DESYREL) tablet 50 mg  50 mg Oral QHS,MR X 1 Kerry Hough, PA-C   50 mg at 01/01/15 2059    Lab Results: No results found for this or any previous visit (from the past 48 hour(s)).  Physical Findings: AIMS: Facial and Oral Movements Muscles of Facial Expression: None, normal Lips and Perioral Area: None, normal Jaw: None, normal Tongue: None, normal,Extremity Movements Upper (arms, wrists, hands, fingers): None, normal Lower (legs, knees, ankles, toes): None, normal, Trunk Movements Neck, shoulders, hips: None, normal, Overall Severity Severity of abnormal movements (highest score from questions above): None, normal Incapacitation due to abnormal movements: None, normal Patient's awareness of abnormal movements (rate only patient's report): No Awareness, Dental Status Current problems with teeth and/or dentures?: No Does patient usually wear dentures?: No  CIWA:  CIWA-Ar Total: 2 COWS:     Treatment Plan Summary: Daily contact with patient to assess and evaluate symptoms and progress in treatment and Medication management Supportive approach/coping skills Alcohol dependence; continue librium detox protocol/work a  relapse prevention plan Mood instability; will D/C the Seroquel 50 mg TID an use Seroquel 200 mg HS Depression; will resume the Cymbalta starting at 30 mg AM Anxiety/hyperactivity; will resume the Clonidine 0.1 mg BID Explore residential treatment options  Will continue with current treatment plan; no changes at this time.  Patient to be discharged tomorrow.   Medical Decision Making:  Established Problem, Stable/Improving (1), Review of Psycho-Social Stressors (1), Review of Last Therapy Session (1) and Review of Medication Regimen & Side Effects (2)  Rankin, Shuvon, FNP-BC 01/02/2015, 12:50 PM

## 2015-01-02 NOTE — Plan of Care (Signed)
Problem: Alteration in mood & ability to function due to Goal: STG-Patient will attend groups Outcome: Progressing Pt attended evening AA group     

## 2015-01-02 NOTE — [facility] Group Notes (Signed)
Adult Psychoeducational Group Note  Date:  01/02/2015 Time:  12:10 PM  Group Topic/Focus:  Making Healthy Choices:   The focus of this group is to help patients identify negative/unhealthy choices they were using prior to admission and identify positive/healthier coping strategies to replace them upon discharge.  Participation Level:  Did Not Attend  Participation Quality:    Affect:    Cognitive:    Insight:   Engagement in Group:     Modes of Intervention:    Additional Comments:    Barton Fanny 01/02/2015, 12:10 PM

## 2015-01-03 DIAGNOSIS — F1024 Alcohol dependence with alcohol-induced mood disorder: Principal | ICD-10-CM

## 2015-01-03 MED ORDER — DULOXETINE HCL 30 MG PO CPEP: 30.0000 mg | ORAL_CAPSULE | Freq: Every day | ORAL | Status: DC

## 2015-01-03 MED ORDER — TRAZODONE HCL 50 MG PO TABS: 50.0000 mg | ORAL_TABLET | Freq: Every evening | ORAL | Status: DC | PRN

## 2015-01-03 MED ORDER — CLONIDINE HCL 0.1 MG PO TABS: 0.1000 mg | ORAL_TABLET | Freq: Two times a day (BID) | ORAL | Status: DC

## 2015-01-03 MED ORDER — ADULT MULTIVITAMIN W/MINERALS CH: 1.0000 | ORAL_TABLET | Freq: Every day | ORAL | Status: DC

## 2015-01-03 MED ORDER — IBUPROFEN 600 MG PO TABS: 600.0000 mg | ORAL_TABLET | Freq: Four times a day (QID) | ORAL | Status: DC | PRN

## 2015-01-03 MED ORDER — CARBAMIDE PEROXIDE 6.5 % OT SOLN: 5.0000 [drp] | Freq: Two times a day (BID) | OTIC | Status: DC

## 2015-01-03 MED ORDER — QUETIAPINE FUMARATE 200 MG PO TABS: 200.0000 mg | ORAL_TABLET | Freq: Every day | ORAL | Status: AC

## 2015-01-03 MED ORDER — CARBAMIDE PEROXIDE 6.5 % OT SOLN
5.0000 [drp] | Freq: Two times a day (BID) | OTIC | Status: DC
  Administered 2015-01-03: 5 [drp] via OTIC
  Filled 2015-01-03: qty 15

## 2015-01-03 NOTE — Discharge Summary (Signed)
Physician Discharge Summary Note  Patient:  Joshua Dillon is an 29 y.o., male MRN:  [id number] DOB:  November 06, 1985 Patient phone:  [phone] (home)  Patient address:   [address] [place] [address],  Total Time spent with patient: 45 minutes  Date of Admission:  12/30/2014 Date of Discharge: 01/03/2015  Reason for Admission:  Per H&P Note:  29 Y/o male who states he moved and he did not changed his address. [facility] Has not have his medications at least for a month. Drinks a lot of beer 10 or more in the evening, has done so for the past several months 3-4 days a week. Was in the [employer] 2 years. States he gets home from work and feels all alone starts drinking. Sates he loses count. He cant explain why he cut himself, does not know the particular circumstances.  Joshua Dillon is a Caucasian, single, employed 29 y.o. male w/ hx of alcohol abuse and depression presenting to [facility] due to alcohol intoxication and SI. Pt presents with disheveled appearance and depressed, anxious mood. Affect is congruent. Pt seems embarrassed and ashamed of his depression and alcohol use, repeatedly attempting to minimize its severity. Pt is restless throughout assessment, as well as slightly irritable and guarded at times. Thought process is circumstantial but evidences no delusional content. Pt does not appear to be responding to internal stimuli. He is oriented x4 and cooperative. Pt reports worsening depression and drinking heavily over the past 3-4 days, triggered by feelings of loneliness. Pt states "I just can't face the day sober lately". Pt states several times during interview that he feels alone and has "no one to talk to" and no social supports. Pt reports cutting his body with a broken light bulb last night, adding "I don't know what happened last night. I've never cut or hurt myself before then". He denies any hx of self-harming behaviors. He denies any past suicide  attempts, but per chart review, pt attempted suicide on at least one occasion in the past by cutting his throat.   Principal Problem: Alcohol abuse with alcohol-induced mood disorder Discharge Diagnoses: Patient Active Problem List   Diagnosis Date Noted  . Alcohol abuse with alcohol-induced mood disorder [F10.14] 12/30/2014  . Mood disorder [F39] 03/27/2012  . Alcohol dependence [F10.20] 03/25/2012  . Marijuana use [F12.10] 03/22/2012  . Elevated AST (SGOT) [R74.0] 03/22/2012  . Smokeless tobacco use [Z72.0] 03/22/2012  . Tobacco use [Z72.0] 03/22/2012  . Seizure [R56.9] 03/19/2012  . Alcohol abuse [F10.10] 03/19/2012  . Hypokalemia [E87.6] 03/19/2012  . ADHD (attention deficit hyperactivity disorder) [F90.9]   . Anxiety [F41.9]   . Depression [F32.9]     Musculoskeletal: Strength & Muscle Tone: within normal limits Gait & Station: normal Patient leans: N/A  Psychiatric Specialty Exam:  See Suicide Risk Assessment Physical Exam  Nursing note and vitals reviewed. Constitutional: He is oriented to person, place, and time.  Neck: Normal range of motion.  Respiratory: Effort normal.  Musculoskeletal: Normal range of motion.  Neurological: He is alert and oriented to person, place, and time.    Review of Systems  Psychiatric/Behavioral: Positive for substance abuse. Negative for suicidal ideas and hallucinations. Depression: Stable. Nervous/anxious: Stable. Insomnia: Stable.   All other systems reviewed and are negative.   Blood pressure 122/71, pulse 108, temperature 98 F (36.7 C), temperature source Oral, resp. rate 18, height 5' 11.65" (1.82 m), weight 95.255 kg (210 lb).Body mass index is 28.76 kg/(m^2).  Have you used any form of tobacco in the last 30 days? (Cigarettes, Smokeless Tobacco, Cigars, and/or Pipes): Yes  Has this patient used any form of tobacco in the last 30 days? (Cigarettes, Smokeless Tobacco, Cigars, and/or Pipes) Yes, A prescription for an FDA-approved  tobacco cessation medication was offered at discharge and the patient refused  Past Medical History:  Past Medical History  Diagnosis Date  . Depression   . ADHD (attention deficit hyperactivity disorder)   . Anxiety   . Alcoholism   . Seizure 03/2012    Secondary to tramadol.  . Hypercholesterolemia    History reviewed. No pertinent past surgical history. Family History:  Family History  Problem Relation Age of Onset  . Drug abuse Mother     narcotics  . Alcohol abuse Paternal Grandmother   . Anxiety disorder Father   . Bipolar disorder Mother    Social History:  History  Alcohol Use  . 16.5 oz/week  . 33 drink(s) per week    Comment: daily     History  Drug Use  . 7.00 per week  . Special: Marijuana, Benzodiazepines    Social History   Social History  . Marital Status: Single    Spouse Name: N/A  . Number of Children: N/A  . 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 Smoking status: Current Every Day Smoker -- 0.30 packs/day for 8 years    Types: Cigarettes  . Smokeless tobacco: Current User    Types: Snuff, Chew  . Alcohol Use: 16.5 oz/week    33 drink(s) per week     Comment: daily  . Drug Use: 7.00 per week    Special: Marijuana, Benzodiazepines  . Sexual Activity: Not Currently   Other Topics Concern  . None   Social History Narrative   Risk to Self: Is patient at risk for suicide?: Yes Risk to Others:   Prior Inpatient Therapy:   Prior Outpatient Therapy:    Level of Care:  OP  Hospital Course:  Joshua Dillon was admitted for Alcohol abuse with alcohol-induced mood disorder and crisis management.  He was treated discharged with the medications listed below under Medication List.  Medical problems were identified and treated as needed.  Home medications were restarted as appropriate.  Improvement was monitored by observation and Joshua Dillon daily report of  symptom reduction.  Emotional and mental status was monitored by daily self-inventory reports completed by Joshua Dillon and clinical staff.         Joshua Dillon was evaluated by the treatment team for stability and plans for continued recovery upon discharge.  Joshua Dillon motivation was an integral factor for scheduling further treatment.  Employment, transportation, bed availability, health status, family support, and any pending legal issues were also considered during his hospital stay.  He was offered further treatment options upon discharge including but not limited to Residential, Intensive Outpatient, and Outpatient treatment.  Joshua Dillon will follow up with the services as listed below under Follow Up Information.     Upon completion of this admission the patient was both mentally and medically stable for discharge denying suicidal/homicidal ideation, auditory/visual/tactile hallucinations, delusional thoughts and paranoia.      Consults:  psychiatry  Significant Diagnostic Studies:  labs: Reviewed  Discharge Vitals:   Blood pressure 122/71, pulse 108, temperature 98 F (36.7 C), temperature source Oral, resp. rate 18, height  5' 11.65" (1.82 m), weight 95.255 kg (210 lb). Body mass index is 28.76 kg/(m^2). Lab Results:   No results found for this or any previous visit (from the past 72 hour(s)).  Physical Findings: AIMS: Facial and Oral Movements Muscles of Facial Expression: None, normal Lips and Perioral Area: None, normal Jaw: None, normal Tongue: None, normal,Extremity Movements Upper (arms, wrists, hands, fingers): None, normal Lower (legs, knees, ankles, toes): None, normal, Trunk Movements Neck, shoulders, hips: None, normal, Overall Severity Severity of abnormal movements (highest score from questions above): None, normal Incapacitation due to abnormal movements: None, normal Patient's awareness of abnormal movements (rate only patient's report):  No Awareness, Dental Status Current problems with teeth and/or dentures?: No Does patient usually wear dentures?: No  CIWA:  CIWA-Ar Total: 2 COWS:      See Psychiatric Specialty Exam and Suicide Risk Assessment completed by Attending Physician prior to discharge.  Discharge destination:  Other:  Home to follow up with referral to [facility]  Is patient on multiple antipsychotic therapies at discharge:  No   Has Patient had three or more failed trials of antipsychotic monotherapy by history:  No    Recommended Plan for Multiple Antipsychotic Therapies: NA  Discharge Instructions    Activity as tolerated - No restrictions    Complete by:  As directed      Diet general    Complete by:  As directed      Discharge instructions    Complete by:  As directed   Take all of you medications as prescribed by your mental healthcare provider.  Report any adverse effects and reactions from your medications to your outpatient provider promptly. Do not engage in alcohol and or illegal drug use while on prescription medicines. In the event of worsening symptoms call the crisis hotline, 911, and or go to the nearest emergency department for appropriate evaluation and treatment of symptoms. Follow-up with your primary care provider for your medical issues, concerns and or health care needs.   Keep all scheduled appointments.  If you are unable to keep an appointment call to reschedule.  Let the nurse know if you will need medications before next scheduled appointment.            Medication List    TAKE these medications      Indication   carbamide peroxide 6.5 % otic solution  Commonly known as:  DEBROX  Place 5 drops into the left ear 2 (two) times daily.   Indication:  Removal of Wax From the Ear     cloNIDine 0.1 MG tablet  Commonly known as:  CATAPRES  Take 1 tablet (0.1 mg total) by mouth 2 (two) times daily.   Indication:  High Blood Pressure     DULoxetine 30 MG capsule  Commonly  known as:  CYMBALTA  Take 1 capsule (30 mg total) by mouth daily.   Indication:  Major Depressive Disorder     multivitamin with minerals Tabs tablet  Take 1 tablet by mouth daily.   Indication:  Nutritional Support     QUEtiapine 200 MG tablet  Commonly known as:  SEROQUEL  Take 1 tablet (200 mg total) by mouth at bedtime.   Indication:  Mood control     traZODone 50 MG tablet  Commonly known as:  DESYREL  Take 1 tablet (50 mg total) by mouth at bedtime and may repeat dose one time if needed.   Indication:  Trouble Sleeping  Follow-up Information    Follow up with [facility].   Why:  Referral sent 01/01/15 per patient request. Pt should continue to call daily to secure a bed for rehab.   Contact information:   [address] [place], [place] [address] Phone: [phone] Fax: [phone]      Follow-up recommendations:  Activity:  As tolerated Diet:  As tolerated  Comments:   Patient has been instructed to take medications as prescribed; and report adverse effects to outpatient provider.  Follow up with primary doctor for any medical issues and If symptoms recur report to nearest emergency or crisis hot line.    Total Discharge Time: 45 minutes  Signed: Assunta Found, FNP-BC 01/03/2015, 9:11 AM  I have examined the patient and agree with the discharge plan and findings. I have also done suicide assessment on this patient.

## 2015-01-03 NOTE — [facility] Group Notes (Signed)
[facility] Group Notes: (Clinical Social Work)   01/03/2015      Type of Therapy:  Group Therapy   Participation Level:  Did Not Attend despite MHT prompting   Ambrose Mantle, LCSW 01/03/2015, 10:59 AM

## 2015-01-03 NOTE — [facility] Suicide Risk Assessment (Signed)
[facility] Discharge Suicide Risk Assessment   Demographic Factors:  Male  Total Time spent with patient: 30 minutes  Musculoskeletal: Strength & Muscle Tone: within normal limits Gait & Station: normal Patient leans: N/A  Psychiatric Specialty Exam: Physical Exam  Constitutional: He appears well-developed and well-nourished. No distress.  Skin: He is not diaphoretic.    Review of Systems  Constitutional: Negative.   Psychiatric/Behavioral: Negative for depression and suicidal ideas. The patient is not nervous/anxious.     Blood pressure 122/71, pulse 108, temperature 98 F (36.7 C), temperature source Oral, resp. rate 18, height 5' 11.65" (1.82 m), weight 95.255 kg (210 lb).Body mass index is 28.76 kg/(m^2).  General Appearance: Casual  Eye Contact::  Fair  Speech:  Normal Rate409  Volume:  Normal  Mood:  Euthymic  Affect:  Congruent  Thought Process:  Coherent  Orientation:  Full (Time, Place, and Person)  Thought Content:  Rumination  Suicidal Thoughts:  No  Homicidal Thoughts:  No  Memory:  Immediate;   Fair Recent;   Fair  Judgement:  Fair  Insight:  Fair  Psychomotor Activity:  Normal  Concentration:  Fair  Recall:  [employer] of Knowledge:Fair  Language: Fair  Akathisia:  Negative  Handed:  Right  AIMS (if indicated):     Assets:  Desire for Improvement Housing Physical Health Social Support  Sleep:  Number of Hours: 6  Cognition: WNL  ADL's:  Intact   Have you used any form of tobacco in the last 30 days? (Cigarettes, Smokeless Tobacco, Cigars, and/or Pipes): Yes  Has this patient used any form of tobacco in the last 30 days? (Cigarettes, Smokeless Tobacco, Cigars, and/or Pipes) Yes, A prescription for an FDA-approved tobacco cessation medication was offered at discharge and the patient refused  Mental Status Per Nursing Assessment::   On Admission:     Current Mental Status by Physician: see MSE  Loss Factors: Loss of significant  relationship  Historical Factors: Impulsivity  Risk Reduction Factors:   Positive therapeutic relationship and Positive coping skills or problem solving skills  Continued Clinical Symptoms:  Dysthymia More than one psychiatric diagnosis Previous Psychiatric Diagnoses and Treatments  Cognitive Features That Contribute To Risk:  Closed-mindedness    Suicide Risk:  Minimal: No identifiable suicidal ideation.  Patients presenting with no risk factors but with morbid ruminations; may be classified as minimal risk based on the severity of the depressive symptoms  Principal Problem: Alcohol abuse with alcohol-induced mood disorder Discharge Diagnoses:  Patient Active Problem List   Diagnosis Date Noted  . Alcohol abuse with alcohol-induced mood disorder [F10.14] 12/30/2014  . Mood disorder [F39] 03/27/2012  . Alcohol dependence [F10.20] 03/25/2012  . Marijuana use [F12.10] 03/22/2012  . Elevated AST (SGOT) [R74.0] 03/22/2012  . Smokeless tobacco use [Z72.0] 03/22/2012  . Tobacco use [Z72.0] 03/22/2012  . Seizure [R56.9] 03/19/2012  . Alcohol abuse [F10.10] 03/19/2012  . Hypokalemia [E87.6] 03/19/2012  . ADHD (attention deficit hyperactivity disorder) [F90.9]   . Anxiety [F41.9]   . Depression [F32.9]     Follow-up Information    Follow up with [facility].   Why:  Referral sent 01/01/15 per patient request. Pt should continue to call daily to secure a bed for rehab.   Contact information:   [address] [place], [place] [address] Phone: [phone] Fax: [phone]      Plan Of Care/Follow-up recommendations:  Activity:  as tolerated Diet:  regular See Discharge summary for details. Plan is for Rehab 28 days  as well.  Mood and motivation improved.  Is patient on multiple antipsychotic therapies at discharge:  No   Has Patient had three or more failed trials of antipsychotic monotherapy by history:  No  Recommended Plan for Multiple Antipsychotic  Therapies: NA    AKHTAR, NADEEM 01/03/2015, 9:13 AM

## 2015-01-03 NOTE — Progress Notes (Signed)
Pt complained of severe ear pain and drainage from his left ear, stated it kept him up a good bit of the night.  Drenda Freeze, NP came at request of Casimiro Needle RN to look at Pt's ear this morning before breakfast.  Pt states NP was supposed to look at it yesterday but the instrument used to look into his ear wasn't working, the light was out, so she did not do so.  Pt stated it seems to start hurting worse when he lays down in bed.  Given extra pillows last night to raise his head a bit more but this proved to be ineffective.  Awaiting NP orders.

## 2015-01-03 NOTE — Progress Notes (Signed)
  [facility] Adult Case Management Discharge Plan :  Will you be returning to the same living situation after discharge:  Yes,  witih mother At discharge, do you have transportation home?: Yes,  family Do you have the ability to pay for your medications: Yes,  no issues raised  Release of information consent forms completed and in the chart;  Patient's signature needed at discharge.  Patient to Follow up at: Follow-up Information    Follow up with [facility].   Why:  Referral sent 01/01/15 per patient request. Pt should continue to call daily to secure a bed for rehab.   Contact information:   [address] [place], [place] [address] Phone: [phone] Fax: [phone]      Patient denies SI/HI: Yes,  see doctor d/c note    Safety Planning and Suicide Prevention discussed: Yes,  with pt, and refused with family  Have you used any form of tobacco in the last 30 days? (Cigarettes, Smokeless Tobacco, Cigars, and/or Pipes): Yes  Has patient been referred to the Quitline?: Patient refused referral  Sarina Ser 01/03/2015, 8:56 AM

## 2015-01-03 NOTE — Progress Notes (Signed)
Patient ID: Joshua Dillon, male   DOB: Mar 09, 1986, 29 y.o.   MRN: [id number]   Pt was discharged home with family, he reported that he has a great family and that they were going to assist him with getting help. Pt reported that he was negative SI/HI, no AH/VH noted. Pt reported that he was no longer having withdrawal symptoms. Pt was given his discharge paperwork, and sample medication. Pt was also given the remainder of his ear drops. No other issues or concerns noted.

## 2015-04-14 NOTE — Progress Notes (Unsigned)
B/P: 95/66 Pulse: 89 taken by Hedda SladePatricia Settle RN

## 2015-10-13 ENCOUNTER — Emergency Department (HOSPITAL_COMMUNITY): Payer: Non-veteran care

## 2015-10-13 ENCOUNTER — Encounter (HOSPITAL_COMMUNITY): Payer: Self-pay | Admitting: Emergency Medicine

## 2015-10-13 ENCOUNTER — Emergency Department (HOSPITAL_COMMUNITY)
Admission: EM | Admit: 2015-10-13 | Discharge: 2015-10-13 | Disposition: A | Discharge status: Home / Self Care | Payer: Non-veteran care | Attending: Emergency Medicine | Admitting: Emergency Medicine

## 2015-10-13 DIAGNOSIS — R109 Unspecified abdominal pain: Secondary | ICD-10-CM | POA: Diagnosis present

## 2015-10-13 DIAGNOSIS — Z79899 Other long term (current) drug therapy: Secondary | ICD-10-CM | POA: Insufficient documentation

## 2015-10-13 DIAGNOSIS — I471 Supraventricular tachycardia: Secondary | ICD-10-CM | POA: Diagnosis not present

## 2015-10-13 DIAGNOSIS — F329 Major depressive disorder, single episode, unspecified: Secondary | ICD-10-CM | POA: Insufficient documentation

## 2015-10-13 DIAGNOSIS — R112 Nausea with vomiting, unspecified: Secondary | ICD-10-CM | POA: Insufficient documentation

## 2015-10-13 DIAGNOSIS — I10 Essential (primary) hypertension: Secondary | ICD-10-CM | POA: Insufficient documentation

## 2015-10-13 DIAGNOSIS — F17210 Nicotine dependence, cigarettes, uncomplicated: Secondary | ICD-10-CM | POA: Insufficient documentation

## 2015-10-13 DIAGNOSIS — R51 Headache: Secondary | ICD-10-CM | POA: Diagnosis not present

## 2015-10-13 LAB — RAPID URINE DRUG SCREEN, HOSP PERFORMED
Amphetamines: NOT DETECTED
BARBITURATES: NOT DETECTED
Benzodiazepines: NOT DETECTED
Cocaine: NOT DETECTED
Opiates: NOT DETECTED
Tetrahydrocannabinol: POSITIVE — AB

## 2015-10-13 LAB — ACETAMINOPHEN LEVEL: Acetaminophen (Tylenol), Serum: 14 ug/mL (ref 10–30)

## 2015-10-13 LAB — TSH: TSH: 0.418 u[IU]/mL (ref 0.350–4.500)

## 2015-10-13 LAB — CBC
HEMATOCRIT: 45.4 % (ref 39.0–52.0)
Hemoglobin: 16.2 g/dL (ref 13.0–17.0)
MCH: 32.5 pg (ref 26.0–34.0)
MCHC: 35.7 g/dL (ref 30.0–36.0)
MCV: 91.2 fL (ref 78.0–100.0)
PLATELETS: 249 10*3/uL (ref 150–400)
RBC: 4.98 MIL/uL (ref 4.22–5.81)
RDW: 12.9 % (ref 11.5–15.5)
WBC: 11.1 10*3/uL — AB (ref 4.0–10.5)

## 2015-10-13 LAB — BASIC METABOLIC PANEL
ANION GAP: 5 (ref 5–15)
Anion gap: 8 (ref 5–15)
BUN: 8 mg/dL (ref 6–20)
BUN: 7 mg/dL (ref 6–20)
CALCIUM: 8 mg/dL — AB (ref 8.9–10.3)
CHLORIDE: 101 mmol/L (ref 101–111)
CO2: 27 mmol/L (ref 22–32)
CO2: 26 mmol/L (ref 22–32)
CREATININE: 1.32 mg/dL — AB (ref 0.61–1.24)
Calcium: 9 mg/dL (ref 8.9–10.3)
Chloride: 104 mmol/L (ref 101–111)
Creatinine, Ser: 1.29 mg/dL — ABNORMAL HIGH (ref 0.61–1.24)
GFR calc Af Amer: >60 mL/min (ref >60)
GFR calc non Af Amer: >60 mL/min (ref >60)
GLUCOSE: 142 mg/dL — AB (ref 65–99)
GLUCOSE: 114 mg/dL — AB (ref 65–99)
POTASSIUM: 2.7 mmol/L — AB (ref 3.5–5.1)
Potassium: 3 mmol/L — ABNORMAL LOW (ref 3.5–5.1)
Sodium: 136 mmol/L (ref 135–145)
Sodium: 135 mmol/L (ref 135–145)

## 2015-10-13 LAB — URINALYSIS, ROUTINE W REFLEX MICROSCOPIC
BILIRUBIN URINE: NEGATIVE
GLUCOSE, UA: NEGATIVE mg/dL
Hgb urine dipstick: NEGATIVE
Leukocytes, UA: NEGATIVE
Nitrite: NEGATIVE
PH: 7 (ref 5.0–8.0)
Protein, ur: 100 mg/dL — AB
Specific Gravity, Urine: 1.02 (ref 1.005–1.030)

## 2015-10-13 LAB — ETHANOL

## 2015-10-13 LAB — URINE MICROSCOPIC-ADD ON

## 2015-10-13 LAB — TROPONIN I: Troponin I: <0.03 ng/mL (ref <0.03)

## 2015-10-13 LAB — LIPASE, BLOOD: LIPASE: 17 U/L (ref 11–51)

## 2015-10-13 LAB — SALICYLATE LEVEL: Salicylate Lvl: <4 mg/dL (ref 2.8–30.0)

## 2015-10-13 LAB — MAGNESIUM: MAGNESIUM: 1.8 mg/dL (ref 1.7–2.4)

## 2015-10-13 LAB — CBG MONITORING, ED: GLUCOSE-CAPILLARY: 96 mg/dL (ref 65–99)

## 2015-10-13 MED ORDER — ADENOSINE 6 MG/2ML IV SOLN: 6.0000 mg | Freq: Once | INTRAVENOUS | Status: DC

## 2015-10-13 MED ORDER — SODIUM CHLORIDE 0.9 % IV BOLUS (SEPSIS)
1000.0000 mL | Freq: Once | INTRAVENOUS | Status: AC
  Administered 2015-10-13: 1000 mL via INTRAVENOUS

## 2015-10-13 MED ORDER — ADENOSINE 6 MG/2ML IV SOLN
INTRAVENOUS | Status: AC
  Filled 2015-10-13: qty 6

## 2015-10-13 MED ORDER — METOCLOPRAMIDE HCL 5 MG/ML IJ SOLN
10.0000 mg | Freq: Once | INTRAMUSCULAR | Status: AC
  Administered 2015-10-13: 10 mg via INTRAVENOUS
  Filled 2015-10-13: qty 2

## 2015-10-13 MED ORDER — PROMETHAZINE HCL 25 MG PO TABS: 25.0000 mg | ORAL_TABLET | Freq: Four times a day (QID) | ORAL | Status: DC | PRN

## 2015-10-13 MED ORDER — KETOROLAC TROMETHAMINE 30 MG/ML IJ SOLN
30.0000 mg | Freq: Once | INTRAMUSCULAR | Status: AC
  Administered 2015-10-13: 30 mg via INTRAVENOUS
  Filled 2015-10-13: qty 1

## 2015-10-13 MED ORDER — IOPAMIDOL (ISOVUE-300) INJECTION 61%
100.0000 mL | Freq: Once | INTRAVENOUS | Status: AC | PRN
  Administered 2015-10-13: 100 mL via INTRAVENOUS

## 2015-10-13 MED ORDER — POTASSIUM CHLORIDE CRYS ER 10 MEQ PO TBCR: 10.0000 meq | EXTENDED_RELEASE_TABLET | Freq: Two times a day (BID) | ORAL | Status: DC

## 2015-10-13 MED ORDER — CLONIDINE HCL 0.1 MG PO TABS: 0.1000 mg | ORAL_TABLET | Freq: Two times a day (BID) | ORAL | Status: AC

## 2015-10-13 MED ORDER — POTASSIUM CHLORIDE CRYS ER 20 MEQ PO TBCR
40.0000 meq | EXTENDED_RELEASE_TABLET | Freq: Once | ORAL | Status: AC
  Administered 2015-10-13: 40 meq via ORAL
  Filled 2015-10-13: qty 2

## 2015-10-13 MED ORDER — SODIUM CHLORIDE 0.9 % IV BOLUS (SEPSIS)
1000.0000 mL | Freq: Once | INTRAVENOUS | Status: AC
  Administered 2015-10-13 (×2): 1000 mL via INTRAVENOUS

## 2015-10-13 MED ORDER — POTASSIUM CHLORIDE 10 MEQ/100ML IV SOLN
10.0000 meq | INTRAVENOUS | Status: AC
  Administered 2015-10-13 (×2): 10 meq via INTRAVENOUS
  Filled 2015-10-13 (×2): qty 100

## 2015-10-13 MED ORDER — CLONIDINE HCL 0.1 MG PO TABS
0.1000 mg | ORAL_TABLET | Freq: Once | ORAL | Status: AC
  Administered 2015-10-13: 0.1 mg via ORAL
  Filled 2015-10-13: qty 1

## 2015-10-13 MED ORDER — ONDANSETRON HCL 4 MG/2ML IJ SOLN
4.0000 mg | Freq: Once | INTRAMUSCULAR | Status: AC
  Administered 2015-10-13: 4 mg via INTRAVENOUS
  Filled 2015-10-13: qty 2

## 2015-10-13 MED ORDER — DIPHENHYDRAMINE HCL 50 MG/ML IJ SOLN
25.0000 mg | Freq: Once | INTRAMUSCULAR | Status: AC
  Administered 2015-10-13: 25 mg via INTRAVENOUS
  Filled 2015-10-13: qty 1

## 2015-10-13 NOTE — ED Provider Notes (Signed)
Pt received at sign out; repeat labs pending. CT scan reassuring. Repeat labs with improving potassium, normal 2nd trop. Potassium repleted PO. Pt has been ambulatory with steady gait, NAD. Pt has tol PO well without N/V. Wants to go home now. Dx and testing d/w pt and family.  Questions answered.  Verb understanding, agreeable to d/c home with outpt f/u.   Samuel JesterKathleen Yasser Hepp, DO 10/13/15 1705

## 2015-10-13 NOTE — ED Provider Notes (Signed)
CSN: [id number]     Arrival date & time 10/13/15  1202 History  By signing my name below, I, Iona Beard, attest that this documentation has been prepared under the direction and in the presence of Glynn Octave, MD.   Electronically Signed: Iona Beard, ED Scribe. 10/13/2015. 12:42 PM   Chief Complaint  Patient presents with  . Abdominal Pain  . Tachycardia    The history is provided by the patient. No language interpreter was used.   HPI Comments: Joshua Dillon is a 30 y.o. male with PMHx of hypercholesterolemia and anxiety  who presents to the Emergency Department complaining of gradual onset, constant, diffuse abdominal pain, ongoing for three days. When pt arrived to the ED, he was tachycardic into the 160s and had mild chest pain but states he believes it was due to anxiety. He notes associated nausea and emesis x numerous episodes. His last normal bowel movement was three days ago. Pt also complains of gradual onset headache, ongoing for a few days. No sick contact noted. No other associated symptoms noted. Pt takes clonidine for his anxiety but has been out for several days. No other worsening or alleviating factors noted. Pt denies recent travel, recent antibiotics, dysuria, hematuria, testicular pain, hx of DM, light-headedness, dizziness, fevers, chills, or any other pertinent symptoms. Pt uses occasional marijuana. He has not had a drink since the fall of 2016.   Past Medical History  Diagnosis Date  . Depression   . ADHD (attention deficit hyperactivity disorder)   . Anxiety   . Alcoholism (HCC)   . Seizure (HCC) 03/2012    Secondary to tramadol.  . Hypercholesterolemia    History reviewed. No pertinent past surgical history. Family History  Problem Relation Age of Onset  . Drug abuse Mother     narcotics  . Alcohol abuse Paternal Grandmother   . Anxiety disorder Father   . Bipolar disorder Mother    Social History  Substance Use Topics  . Smoking  status: Current Every Day Smoker -- 0.30 packs/day for 8 years    Types: Cigarettes  . Smokeless tobacco: Current User    Types: Snuff, Chew  . Alcohol Use: No     Comment: reports that he quit drinking 2-3 mo ago    Review of Systems A complete 10 system review of systems was obtained and all systems are negative except as noted in the HPI and PMH.    Allergies  Banana  Home Medications   Prior to Admission medications   Medication Sig Start Date End Date Taking? Authorizing Provider  carbamide peroxide (DEBROX) 6.5 % otic solution Place 5 drops into the left ear 2 (two) times daily. 01/03/15   Shuvon B Rankin, NP  cloNIDine (CATAPRES) 0.1 MG tablet Take 1 tablet (0.1 mg total) by mouth 2 (two) times daily. 01/03/15   Shuvon B Rankin, NP  DULoxetine (CYMBALTA) 30 MG capsule Take 1 capsule (30 mg total) by mouth daily. 01/03/15   Shuvon B Rankin, NP  Multiple Vitamin (MULTIVITAMIN WITH MINERALS) TABS tablet Take 1 tablet by mouth daily. 01/03/15   Shuvon B Rankin, NP  QUEtiapine (SEROQUEL) 200 MG tablet Take 1 tablet (200 mg total) by mouth at bedtime. 01/03/15   Shuvon B Rankin, NP  traZODone (DESYREL) 50 MG tablet Take 1 tablet (50 mg total) by mouth at bedtime and may repeat dose one time if needed. 01/03/15   Shuvon B Rankin, NP   BP 114/73 mmHg  Pulse 165  Temp(Src) 98.6 F (37 C) (Oral)  Resp 18  Ht 6' (1.829 m)  Wt 205 lb (92.987 kg)  BMI 27.80 kg/m2  SpO2 97% Physical Exam  Constitutional: He is oriented to person, place, and time. He appears well-developed and well-nourished. No distress.  Anxious with pressured speech.  HENT:  Head: Normocephalic and atraumatic.  Mouth/Throat: Oropharynx is clear and moist. No oropharyngeal exudate.  Eyes: Conjunctivae and EOM are normal. Pupils are equal, round, and reactive to light.  Neck: Normal range of motion. Neck supple.  No meningismus.  Cardiovascular: Regular rhythm, normal heart sounds and intact distal pulses.  Tachycardia  present.   No murmur heard. Tachycardic to 110s.  Pulmonary/Chest: Effort normal and breath sounds normal. No respiratory distress.  Abdominal: Soft. There is tenderness. There is no rebound and no guarding.  Periumbilical TTP. No guarding. No rebound.  Musculoskeletal: Normal range of motion. He exhibits no edema or tenderness.  Neurological: He is alert and oriented to person, place, and time. No cranial nerve deficit. He exhibits normal muscle tone. Coordination normal.  No ataxia on finger to nose bilaterally. No pronator drift. 5/5 strength throughout. CN 2-12 intact.Equal grip strength. Sensation intact.   Skin: Skin is warm.  Psychiatric: His behavior is normal. His mood appears anxious.  Nursing note and vitals reviewed.   ED Course  Procedures (including critical care time) DIAGNOSTIC STUDIES: Oxygen Saturation is 97% on RA, normal by my interpretation.    COORDINATION OF CARE: 12:50 PM Discussed treatment plan which includes CXR, urinalysis, BMP, CBC, and troponin I with pt at bedside and pt agreed to plan.   Labs Review Labs Reviewed  BASIC METABOLIC PANEL - Abnormal; Notable for the following:    Potassium 2.7 (*)    Glucose, Bld 142 (*)    Creatinine, Ser 1.32 (*)    All other components within normal limits  CBC - Abnormal; Notable for the following:    WBC 11.1 (*)    All other components within normal limits  URINALYSIS, ROUTINE W REFLEX MICROSCOPIC (NOT AT [facility]) - Abnormal; Notable for the following:    Ketones, ur TRACE (*)    Protein, ur 100 (*)    All other components within normal limits  URINE RAPID DRUG SCREEN, HOSP PERFORMED - Abnormal; Notable for the following:    Tetrahydrocannabinol POSITIVE (*)    All other components within normal limits  URINE MICROSCOPIC-ADD ON - Abnormal; Notable for the following:    Squamous Epithelial / LPF 0-5 (*)    Bacteria, UA FEW (*)    All other components within normal limits  BASIC METABOLIC PANEL - Abnormal;  Notable for the following:    Potassium 3.0 (*)    Glucose, Bld 114 (*)    Creatinine, Ser 1.29 (*)    Calcium 8.0 (*)    All other components within normal limits  TROPONIN I  LIPASE, BLOOD  MAGNESIUM  TSH  ETHANOL  ACETAMINOPHEN LEVEL  SALICYLATE LEVEL  TROPONIN I  CBG MONITORING, ED    Imaging Review Dg Chest 2 View  10/13/2015  CLINICAL DATA:  Chest pain since yesterday. Tachycardia and elevated blood pressure today. EXAM: CHEST  2 VIEW COMPARISON:  08/12/2015 and 09/29/2014. FINDINGS: The heart size and mediastinal contours are normal. The lungs are clear. There is no pleural effusion or pneumothorax. No acute osseous findings are identified. Stable mild pectus deformity of the sternum. IMPRESSION: Stable chest.  No active cardiopulmonary process. Electronically Signed   By: Chrissie NoaWilliam  Purcell Mouton M.D.   On: 10/13/2015 13:12   Ct Head Wo Contrast  10/13/2015  CLINICAL DATA:  Generalized abdominal pain, nausea, vomiting for 3 days. Headache, hypertension. EXAM: CT HEAD WITHOUT CONTRAST TECHNIQUE: Contiguous axial images were obtained from the base of the skull through the vertex without intravenous contrast. COMPARISON:  09/29/2014 FINDINGS: No acute intracranial abnormality. Specifically, no hemorrhage, hydrocephalus, mass lesion, acute infarction, or significant intracranial injury. No acute calvarial abnormality. Visualized paranasal sinuses and mastoids clear. Orbital soft tissues unremarkable. IMPRESSION: Normal study. Electronically Signed   By: Charlett Nose M.D.   On: 10/13/2015 16:06   Ct Abdomen Pelvis W Contrast  10/13/2015  CLINICAL DATA:  Generalized abdomen pain, nausea and vomiting for 3 days. EXAM: CT ABDOMEN AND PELVIS WITH CONTRAST TECHNIQUE: Multidetector CT imaging of the abdomen and pelvis was performed using the standard protocol following bolus administration of intravenous contrast. CONTRAST:  ISOVUE-300 IOPAMIDOL (ISOVUE-300) INJECTION 61% COMPARISON:  None.  FINDINGS: Lower chest:  No acute findings. Hepatobiliary: The liver and gallbladder are normal. No masses or other significant abnormality. Pancreas: No mass, inflammatory changes, or other significant abnormality. Spleen: Within normal limits in size and appearance. Adrenals/Urinary Tract: The adrenal glands are normal. Bilateral parapelvic renal cysts are identified. No masses identified. No evidence of hydronephrosis. The bladder is normal. Stomach/Bowel: No evidence of obstruction, inflammatory process, or abnormal fluid collections. There is no diverticulitis. The appendix is normal. Vascular/Lymphatic: No pathologically enlarged lymph nodes. No evidence of abdominal aortic aneurysm. Reproductive: No mass or other significant abnormality. Other: None. Musculoskeletal:  No suspicious bone lesions identified. IMPRESSION: No acute abnormality identified in the abdomen and pelvis. No small bowel obstruction or diverticulitis. The appendix is normal. Electronically Signed   By: Sherian Rein M.D.   On: 10/13/2015 16:08   I have personally reviewed and evaluated these images and lab results as part of my medical decision-making.   EKG Interpretation   Date/Time:  Wednesday October 13 2015 12:50:54 EDT Ventricular Rate:  108 PR Interval:  88 QRS Duration: 96 QT Interval:  350 QTC Calculation: 470 R Axis:   82 Text Interpretation:  Sinus tachycardia Borderline T wave abnormalities  Borderline prolonged QT interval T waves now upright Confirmed by Manus Gunning   MD, Fujie Dickison [phone]) on 10/13/2015 1:05:53 PM      MDM   Final diagnoses:  Nausea and vomiting, vomiting of unspecified type  Essential hypertension  SVT (supraventricular tachycardia) (HCC)   Patient reports diffuse abdominal pain with nausea and vomiting for the past 3 days. I will keep anything down. Has had gradual onset headache that was subsequent to vomiting. No diarrhea. No chest pain or shortness of breath. No sick contacts.  Heart  rate 191 in triage. Appears to be SVT. Patient converted on his own to sinus tachycardia in the 110s.  Labs reassuring. Potassium 2.7 however. This is repleted IV. IVF and antiemetics given. Drug screen positive for THC only.  Hypertensive and anxious. States he has not has clonidine for several days. Takes this for anxiety as well as blood pressure according to him. Suspect component of rebound hypertension.  Clonidine given.  Tolerating PO in the ED without further vomiting.  BP improving to 146/92. CT head and abdomen reassuring.  Troponin negative x2 Potassium improved to 3 on recheck.  Refill clonidine. FOllowup with PCP this week for recheck. Continue to replace potassium. Return precautions discussed.  I personally performed the services described in this documentation, which was scribed in my presence. The  recorded information has been reviewed and is accurate.     Glynn OctaveStephen Milderd Manocchio, MD 10/13/15 2021

## 2015-10-13 NOTE — ED Notes (Addendum)
Pt reports generalized abdominal pain with n/v x 3 days. Pt also reports migraines. States he is unable to tolerate anything PO. Pt's HR in 160s in triage. Pt does reports intermittent CP, but attributed it to "being sick."

## 2015-10-13 NOTE — ED Notes (Signed)
CRITICAL VALUE ALERT  Critical value received:  K+ 2.7  Date of notification:  10/13/15  Time of notification:  1305  Critical value read back:Yes.    Nurse who received alert:  JRM  MD notified (1st page):  Rancour  Time of first page:  1305  MD notified (2nd page):  Time of second page:  Responding MD:  Rancour  Time MD responded:  [phone]

## 2015-10-13 NOTE — Discharge Instructions (Signed)
Nausea and Vomiting °Take the nausea medication as prescribed. Follow up with your doctor. Return to the ED if you develop new or worsening symptoms. °Nausea is a sick feeling that often comes before throwing up (vomiting). Vomiting is a reflex where stomach contents come out of your mouth. Vomiting can cause severe loss of body fluids (dehydration). Children and elderly adults can become dehydrated quickly, especially if they also have diarrhea. Nausea and vomiting are symptoms of a condition or disease. It is important to find the cause of your symptoms. °CAUSES  °· Direct irritation of the stomach lining. This irritation can result from increased acid production (gastroesophageal reflux disease), infection, food poisoning, taking certain medicines (such as nonsteroidal anti-inflammatory drugs), alcohol use, or tobacco use. °· Signals from the brain. These signals could be caused by a headache, heat exposure, an inner ear disturbance, increased pressure in the brain from injury, infection, a tumor, or a concussion, pain, emotional stimulus, or metabolic problems. °· An obstruction in the gastrointestinal tract (bowel obstruction). °· Illnesses such as diabetes, hepatitis, gallbladder problems, appendicitis, kidney problems, cancer, sepsis, atypical symptoms of a heart attack, or eating disorders. °· Medical treatments such as chemotherapy and radiation. °· Receiving medicine that makes you sleep (general anesthetic) during surgery. °DIAGNOSIS °Your caregiver may ask for tests to be done if the problems do not improve after a few days. Tests may also be done if symptoms are severe or if the reason for the nausea and vomiting is not clear. Tests may include: °· Urine tests. °· Blood tests. °· Stool tests. °· Cultures (to look for evidence of infection). °· X-rays or other imaging studies. °Test results can help your caregiver make decisions about treatment or the need for additional tests. °TREATMENT °You need to  stay well hydrated. Drink frequently but in small amounts. You may wish to drink water, sports drinks, clear broth, or eat frozen ice pops or gelatin dessert to help stay hydrated. When you eat, eating slowly may help prevent nausea. There are also some antinausea medicines that may help prevent nausea. °HOME CARE INSTRUCTIONS  °· Take all medicine as directed by your caregiver. °· If you do not have an appetite, do not force yourself to eat. However, you must continue to drink fluids. °· If you have an appetite, eat a normal diet unless your caregiver tells you differently. °¨ Eat a variety of complex carbohydrates (rice, wheat, potatoes, bread), lean meats, yogurt, fruits, and vegetables. °¨ Avoid high-fat foods because they are more difficult to digest. °· Drink enough water and fluids to keep your urine clear or pale yellow. °· If you are dehydrated, ask your caregiver for specific rehydration instructions. Signs of dehydration may include: °¨ Severe thirst. °¨ Dry lips and mouth. °¨ Dizziness. °¨ Dark urine. °¨ Decreasing urine frequency and amount. °¨ Confusion. °¨ Rapid breathing or pulse. °SEEK IMMEDIATE MEDICAL CARE IF:  °· You have blood or brown flecks (like coffee grounds) in your vomit. °· You have black or bloody stools. °· You have a severe headache or stiff neck. °· You are confused. °· You have severe abdominal pain. °· You have chest pain or trouble breathing. °· You do not urinate at least once every 8 hours. °· You develop cold or clammy skin. °· You continue to vomit for longer than 24 to 48 hours. °· You have a fever. °MAKE SURE YOU:  °· Understand these instructions. °· Will watch your condition. °· Will get help right away if you are not doing well or   get worse.   This information is not intended to replace advice given to you by your health care provider. Make sure you discuss any questions you have with your health care provider.   Document Released: 04/03/2005 Document Revised:  06/26/2011 Document Reviewed: 08/31/2010 Elsevier Interactive Patient Education [employer]

## 2015-10-13 NOTE — ED Notes (Signed)
Patient ambulatory to restroom  ?

## 2017-01-30 IMAGING — CT CT ABD-PELV W/ CM
2 of 4 series · 16 of 46 positions shown, 18 images · IV contrast (iopamidol)
Comparison: None.

CLINICAL DATA: Generalized abdomen pain, nausea and vomiting for 3
days.

EXAM:
CT ABDOMEN AND PELVIS WITH CONTRAST
TECHNIQUE: Multidetector CT imaging of the abdomen and pelvis was performed
using the standard protocol following bolus administration of
intravenous contrast.
CONTRAST:  100mL [id number] IOPAMIDOL ([id number]) INJECTION 61%

[Series 2: routine abd pel with · axial · 0.70mm/px · z∈[-42,+418]mm · 13 of 102 slices shown, 15 images]
[im 5/102  soft-tissue]
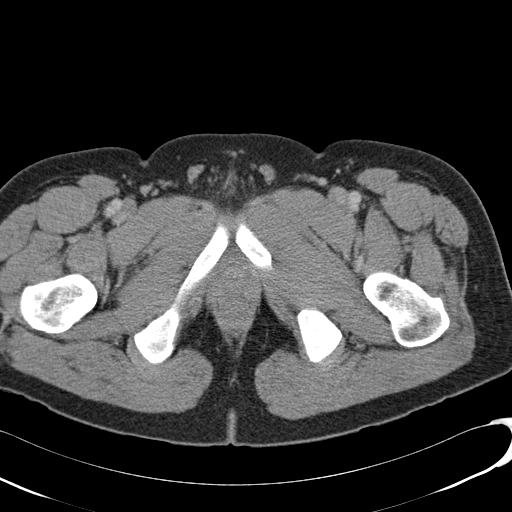
[im 5/102  bone]
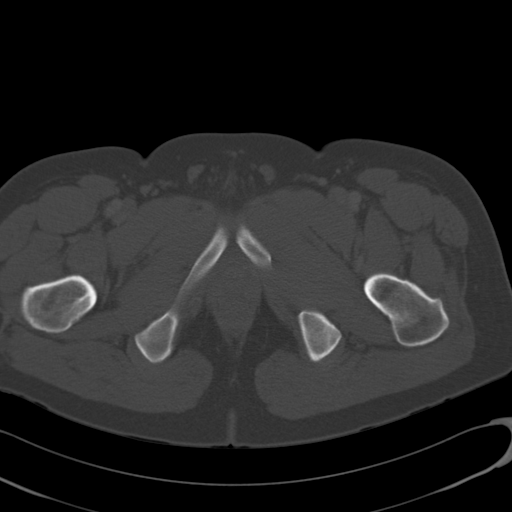
[im 14/102  soft-tissue]
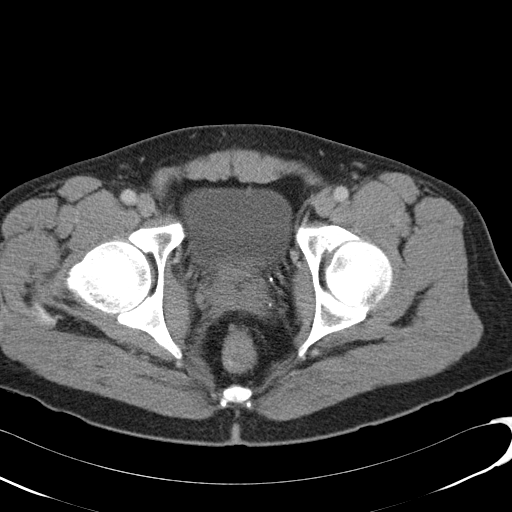
[im 23/102  soft-tissue]
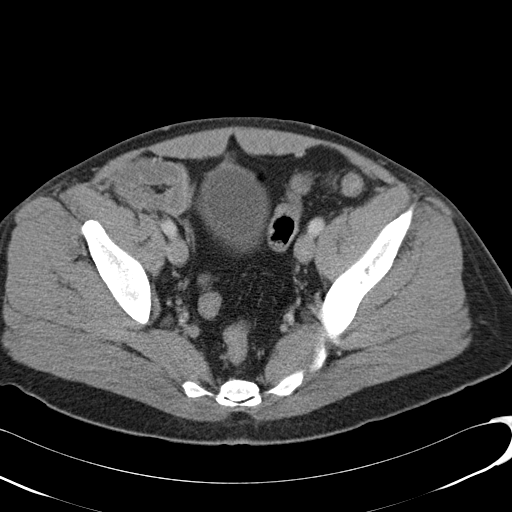
[im 28/102  soft-tissue]
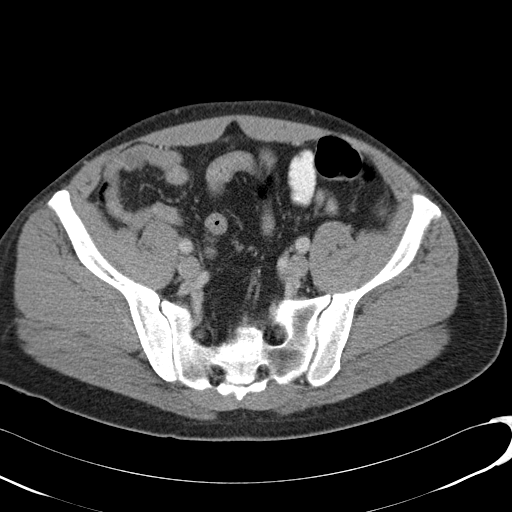
[im 37/102  soft-tissue]
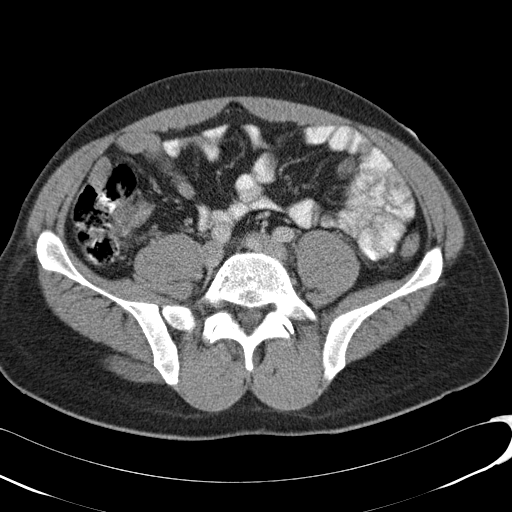
[im 42/102  soft-tissue]
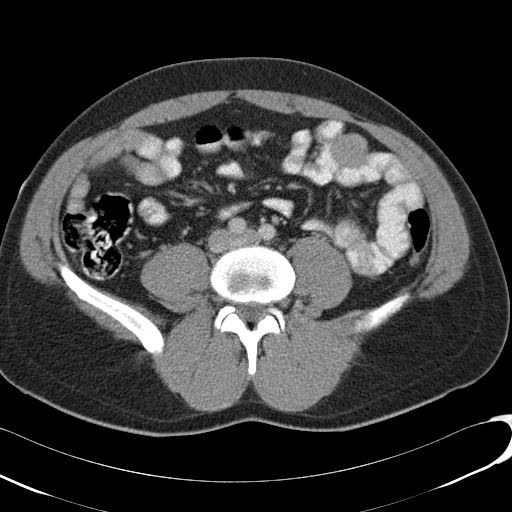
[im 51/102  soft-tissue]
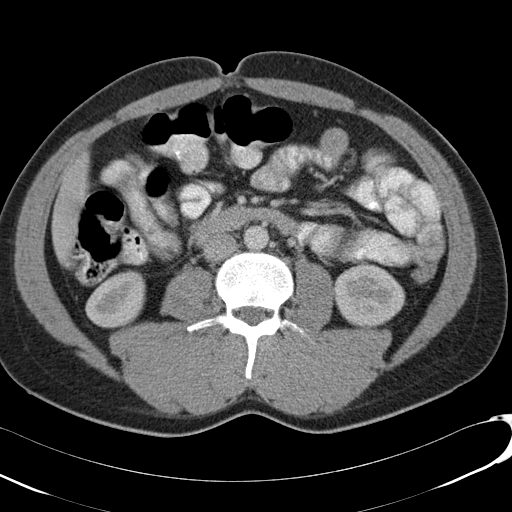
[im 60/102  soft-tissue]
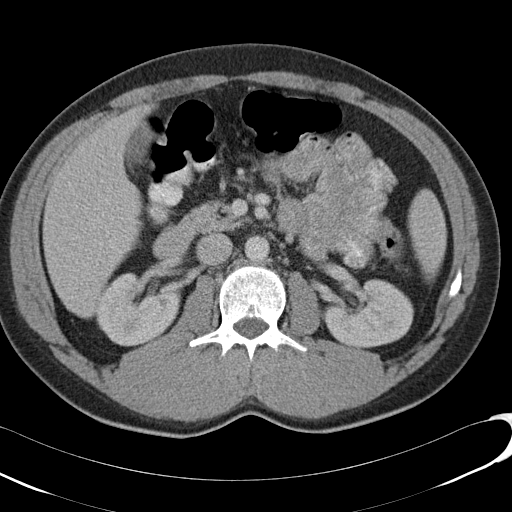
[im 65/102  soft-tissue]
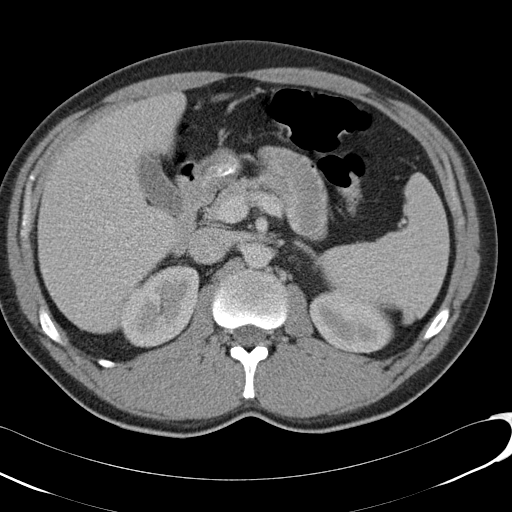
[im 65/102  bone]
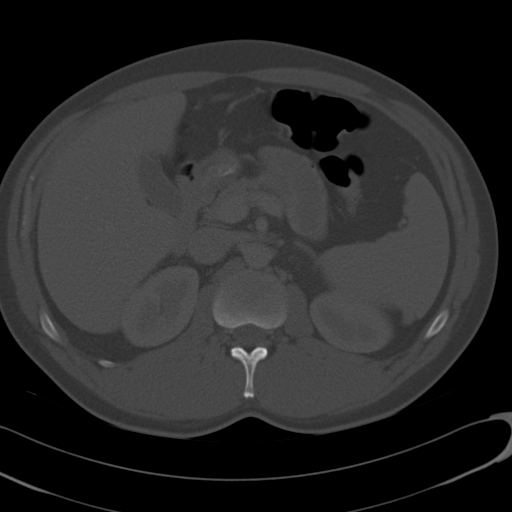
[im 74/102  soft-tissue]
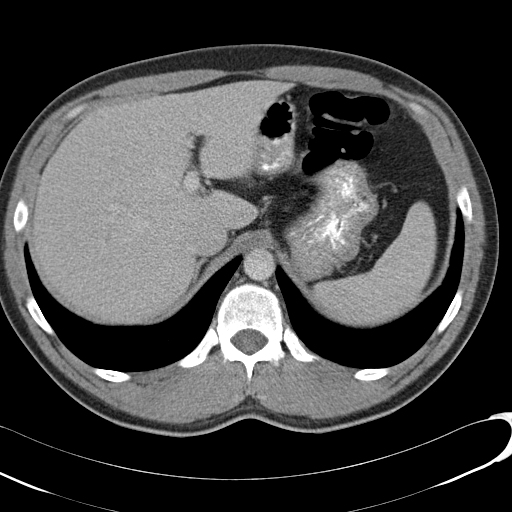
[im 79/102  soft-tissue]
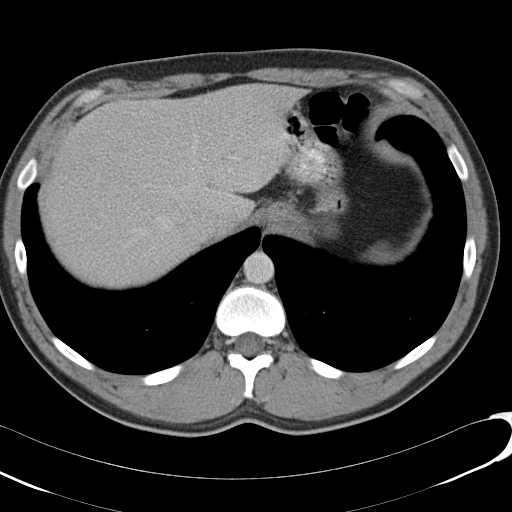
[im 88/102  soft-tissue]
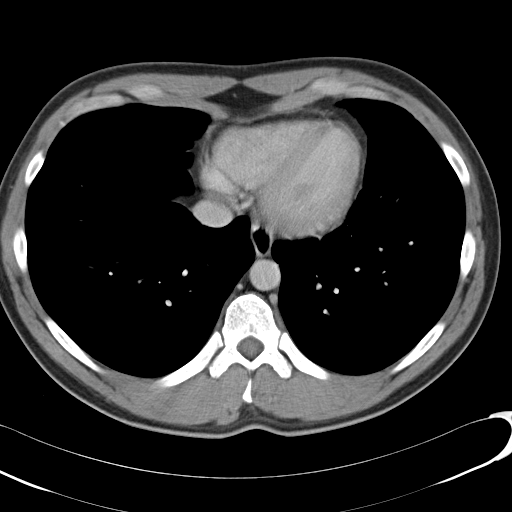
[im 97/102  soft-tissue]
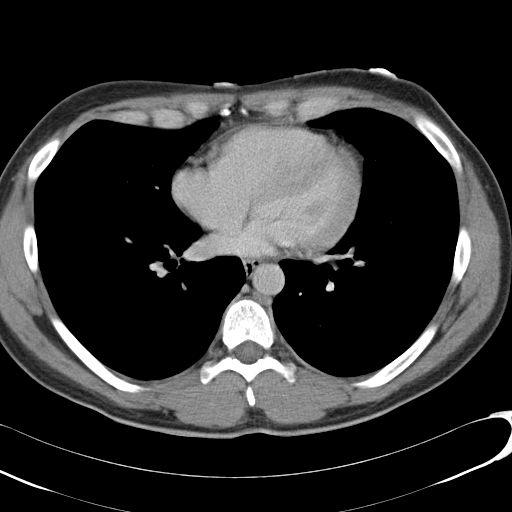

[Series 4: coronal · coronal · 0.71mm/px · 3 of 135 slices shown]
[im 45/135  soft-tissue]
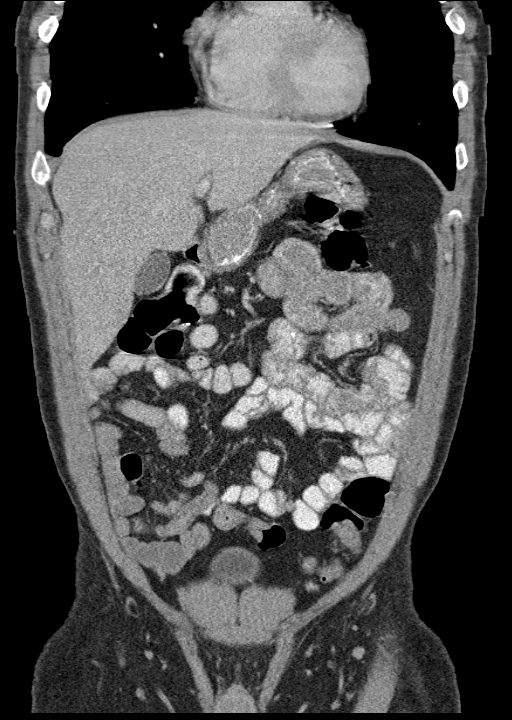
[im 60/135  soft-tissue]
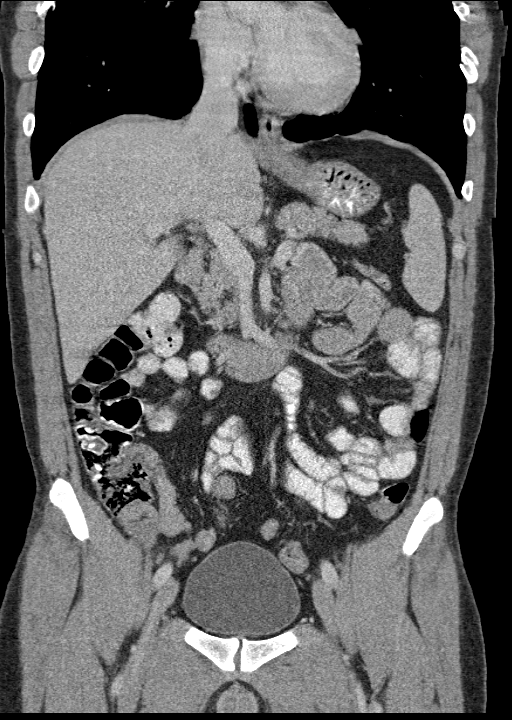
[im 75/135  soft-tissue]
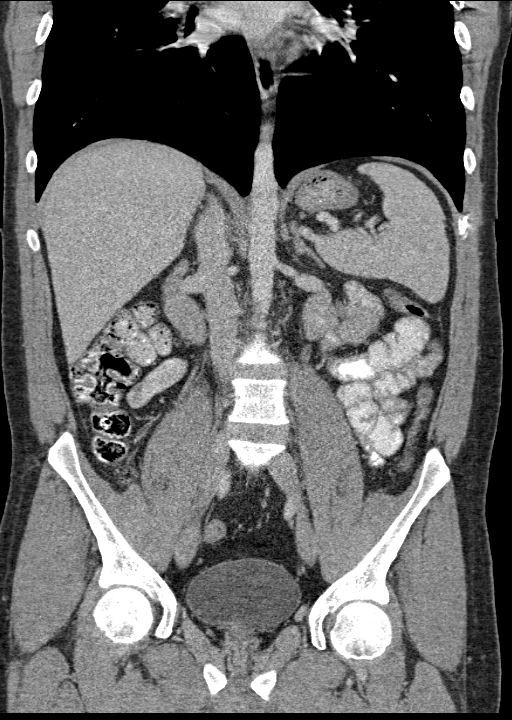

[16 of 46 positions shown; findings below may reference images not displayed]

FINDINGS: Lower chest:  No acute findings.

Hepatobiliary: The liver and gallbladder are normal. No masses or
other significant abnormality.

Pancreas: No mass, inflammatory changes, or other significant
abnormality.

Spleen: Within normal limits in size and appearance.

Adrenals/Urinary Tract: The adrenal glands are normal. Bilateral
parapelvic renal cysts are identified. No masses identified. No
evidence of hydronephrosis. The bladder is normal.

Stomach/Bowel: No evidence of obstruction, inflammatory process, or
abnormal fluid collections. There is no diverticulitis. The appendix
is normal.

Vascular/Lymphatic: No pathologically enlarged lymph nodes. No
evidence of abdominal aortic aneurysm.

Reproductive: No mass or other significant abnormality.

Other: None.

Musculoskeletal:  No suspicious bone lesions identified.
IMPRESSION: No acute abnormality identified in the abdomen and pelvis. No small
bowel obstruction or diverticulitis. The appendix is normal.

## 2018-07-12 ENCOUNTER — Other Ambulatory Visit: Payer: Self-pay

## 2018-07-12 ENCOUNTER — Encounter (HOSPITAL_COMMUNITY): Payer: Self-pay | Admitting: Emergency Medicine

## 2018-07-12 ENCOUNTER — Emergency Department (HOSPITAL_COMMUNITY): Payer: No Typology Code available for payment source

## 2018-07-12 ENCOUNTER — Emergency Department (HOSPITAL_COMMUNITY)
Admission: EM | Admit: 2018-07-12 | Discharge: 2018-07-12 | Disposition: A | Discharge status: Home / Self Care | Payer: No Typology Code available for payment source | Attending: Emergency Medicine | Admitting: Emergency Medicine

## 2018-07-12 DIAGNOSIS — Z79899 Other long term (current) drug therapy: Secondary | ICD-10-CM | POA: Insufficient documentation

## 2018-07-12 DIAGNOSIS — R062 Wheezing: Secondary | ICD-10-CM | POA: Diagnosis not present

## 2018-07-12 DIAGNOSIS — R05 Cough: Secondary | ICD-10-CM | POA: Insufficient documentation

## 2018-07-12 DIAGNOSIS — R0602 Shortness of breath: Secondary | ICD-10-CM | POA: Diagnosis present

## 2018-07-12 DIAGNOSIS — F17210 Nicotine dependence, cigarettes, uncomplicated: Secondary | ICD-10-CM | POA: Diagnosis not present

## 2018-07-12 DIAGNOSIS — R059 Cough, unspecified: Secondary | ICD-10-CM

## 2018-07-12 LAB — CBC WITH DIFFERENTIAL/PLATELET
Abs Immature Granulocytes: 0.03 10*3/uL (ref 0.00–0.07)
BASOS ABS: 0 10*3/uL (ref 0.0–0.1)
BASOS PCT: 0 %
EOS ABS: 0 10*3/uL (ref 0.0–0.5)
Eosinophils Relative: 0 %
HCT: 42.8 % (ref 39.0–52.0)
Hemoglobin: 14.5 g/dL (ref 13.0–17.0)
IMMATURE GRANULOCYTES: 1 %
Lymphocytes Relative: 25 %
Lymphs Abs: 1.3 10*3/uL (ref 0.7–4.0)
MCH: 31.7 pg (ref 26.0–34.0)
MCHC: 33.9 g/dL (ref 30.0–36.0)
MCV: 93.4 fL (ref 80.0–100.0)
MONOS PCT: 11 %
Monocytes Absolute: 0.6 10*3/uL (ref 0.1–1.0)
NRBC: 1 % — AB (ref 0.0–0.2)
Neutro Abs: 3.3 10*3/uL (ref 1.7–7.7)
Neutrophils Relative %: 63 %
Platelets: 267 10*3/uL (ref 150–400)
RBC: 4.58 MIL/uL (ref 4.22–5.81)
RDW: 12.9 % (ref 11.5–15.5)
WBC: 5.1 10*3/uL (ref 4.0–10.5)

## 2018-07-12 LAB — BASIC METABOLIC PANEL
Anion gap: 8 (ref 5–15)
BUN: 9 mg/dL (ref 6–20)
CHLORIDE: 107 mmol/L (ref 98–111)
CO2: 23 mmol/L (ref 22–32)
Calcium: 8.7 mg/dL — ABNORMAL LOW (ref 8.9–10.3)
Creatinine, Ser: 1.1 mg/dL (ref 0.61–1.24)
GFR calc non Af Amer: >60 mL/min (ref >60)
Glucose, Bld: 114 mg/dL — ABNORMAL HIGH (ref 70–99)
POTASSIUM: 3.6 mmol/L (ref 3.5–5.1)
SODIUM: 138 mmol/L (ref 135–145)

## 2018-07-12 LAB — BRAIN NATRIURETIC PEPTIDE: B NATRIURETIC PEPTIDE 5: 45 pg/mL (ref 0.0–100.0)

## 2018-07-12 LAB — D-DIMER, QUANTITATIVE: D-Dimer, Quant: 0.49 ug/mL-FEU (ref 0.00–0.50)

## 2018-07-12 LAB — TROPONIN I

## 2018-07-12 MED ORDER — ALBUTEROL SULFATE HFA 108 (90 BASE) MCG/ACT IN AERS
2.0000 | INHALATION_SPRAY | Freq: Once | RESPIRATORY_TRACT | Status: AC
Start: 2018-07-12 — End: 2018-07-12
  Administered 2018-07-12: 2 via RESPIRATORY_TRACT
  Filled 2018-07-12: qty 6.7

## 2018-07-12 NOTE — ED Notes (Signed)
ED Provider at bedside. 

## 2018-07-12 NOTE — ED Provider Notes (Signed)
[facility] EMERGENCY DEPARTMENT Provider Note   CSN: [id number] Arrival date & time: 07/12/18  4098    History   Chief Complaint Chief Complaint  Patient presents with  . Shortness of Breath    HPI Joshua Dillon is a 33 y.o. male.     HPI  33 year old male presents with a chief complaint of shortness of breath.  He states that over the last 1 week or so he is been having shortness of breath at night and when he walks.  He is a little short of breath at rest.  He has had a cough with some green sputum for about 3 weeks.  No fevers.  He is noticed some constant chest pain at the inferior aspect of his sternum for about 2 days.  He also feels like his diaphragm is spasming.  He smokes and states that during this time he is been smoking more due to anxiety. No leg swelling.  Past Medical History:  Diagnosis Date  . ADHD (attention deficit hyperactivity disorder)   . Alcoholism (HCC)   . Anxiety   . Depression   . Hypercholesterolemia   . Seizure (HCC) 03/2012   Secondary to tramadol.    Patient Active Problem List   Diagnosis Date Noted  . Alcohol abuse with alcohol-induced mood disorder (HCC) 12/30/2014  . Mood disorder (HCC) 03/27/2012  . Alcohol dependence (HCC) 03/25/2012  . Marijuana use 03/22/2012  . Elevated AST (SGOT) 03/22/2012  . Smokeless tobacco use 03/22/2012  . Tobacco use 03/22/2012  . Seizure (HCC) 03/19/2012  . Alcohol abuse 03/19/2012  . Hypokalemia 03/19/2012  . ADHD (attention deficit hyperactivity disorder)   . Anxiety   . Depression     History reviewed. No pertinent surgical history.      Home Medications    Prior to Admission medications   Medication Sig Start Date End Date Taking? Authorizing Provider  cloNIDine (CATAPRES) 0.1 MG tablet Take 1 tablet (0.1 mg total) by mouth 2 (two) times daily. 10/13/15   Rancour, Jeannett Senior, MD  DULoxetine (CYMBALTA) 30 MG capsule Take 1 capsule (30 mg total) by mouth daily. 01/03/15   Rankin, Shuvon  B, NP  potassium chloride SA (K-DUR,KLOR-CON) 10 MEQ tablet Take 1 tablet (10 mEq total) by mouth 2 (two) times daily. 10/13/15   Rancour, Jeannett Senior, MD  promethazine (PHENERGAN) 25 MG tablet Take 1 tablet (25 mg total) by mouth every 6 (six) hours as needed for nausea or vomiting. 10/13/15   Rancour, Jeannett Senior, MD  QUEtiapine (SEROQUEL) 200 MG tablet Take 1 tablet (200 mg total) by mouth at bedtime. Patient taking differently: Take 200 mg by mouth at bedtime as needed (sleep).  01/03/15   Rankin, Rada Hay, NP    Family History Family History  Problem Relation Age of Onset  . Drug abuse Mother        narcotics  . Bipolar disorder Mother   . Alcohol abuse Paternal Grandmother   . Anxiety disorder Father     Social History Social History   Tobacco Use  . Smoking status: Current Every Day Smoker    Packs/day: 0.30    Years: 8.00    Pack years: 2.40    Types: Cigarettes  . Smokeless tobacco: Current User    Types: Snuff, Chew  Substance Use Topics  . Alcohol use: Not Currently    Alcohol/week: 33.0 standard drinks    Types: 33 Standard drinks or equivalent per week  . Drug use: Yes    Frequency:  7.0 times per week    Types: Marijuana, Benzodiazepines     Allergies   Banana   Review of Systems Review of Systems  Constitutional: Negative for fever.  HENT: Negative for sore throat.   Respiratory: Positive for cough and shortness of breath.   Cardiovascular: Positive for chest pain. Negative for leg swelling.  All other systems reviewed and are negative.    Physical Exam Updated Vital Signs BP 118/78   Pulse 86   Temp 98.3 F (36.8 C) (Oral)   Resp 19   Ht 6' (1.829 m)   Wt 81.6 kg   SpO2 100%   BMI 24.41 kg/m   Physical Exam Vitals signs and nursing note reviewed.  Constitutional:      Appearance: He is well-developed.  HENT:     Head: Normocephalic and atraumatic.     Right Ear: External ear normal.     Left Ear: External ear normal.     Nose: Nose normal.   Eyes:     General:        Right eye: No discharge.        Left eye: No discharge.  Neck:     Musculoskeletal: Neck supple.  Cardiovascular:     Rate and Rhythm: Regular rhythm. Tachycardia present.     Heart sounds: Normal heart sounds.  Pulmonary:     Effort: Pulmonary effort is normal. No tachypnea or accessory muscle usage.     Breath sounds: Wheezing (mild expiratory wheezes at bases, intermittent) present.  Chest:     Chest wall: No tenderness.  Abdominal:     Palpations: Abdomen is soft.     Tenderness: There is no abdominal tenderness.  Musculoskeletal:     Right lower leg: He exhibits no tenderness. No edema.     Left lower leg: He exhibits no tenderness. No edema.  Skin:    General: Skin is warm and dry.  Neurological:     Mental Status: He is alert.  Psychiatric:        Mood and Affect: Mood is not anxious.      ED Treatments / Results  Labs (all labs ordered are listed, but only abnormal results are displayed) Labs Reviewed  BASIC METABOLIC PANEL - Abnormal; Notable for the following components:      Result Value   Glucose, Bld 114 (*)    Calcium 8.7 (*)    All other components within normal limits  CBC WITH DIFFERENTIAL/PLATELET - Abnormal; Notable for the following components:   nRBC 1.0 (*)    All other components within normal limits  BRAIN NATRIURETIC PEPTIDE  TROPONIN I  D-DIMER, QUANTITATIVE (NOT AT [facility])    EKG EKG Interpretation  Date/Time:  Friday July 12 2018 09:39:56 EDT Ventricular Rate:  103 PR Interval:    QRS Duration: 91 QT Interval:  337 QTC Calculation: 442 R Axis:   82 Text Interpretation:  Sinus tachycardia LAE, consider biatrial enlargement rate is faster compared to June 2017 Confirmed by Pricilla Loveless [phone]) on 07/12/2018 10:00:42 AM   Radiology Dg Chest 2 View  Result Date: 07/12/2018 CLINICAL DATA:  Cough and shortness of breath for 2 months. EXAM: CHEST - 2 VIEW COMPARISON:  PA and lateral chest 10/13/2015.  FINDINGS: The lungs are clear. Heart size is normal. No pneumothorax or pleural effusion. Pectus deformity noted. No acute bony abnormality. IMPRESSION: No acute disease. Electronically Signed   By: Drusilla Kanner M.D.   On: 07/12/2018 10:31    Procedures Procedures (  including critical care time)  Medications Ordered in ED Medications  albuterol (PROVENTIL HFA;VENTOLIN HFA) 108 (90 Base) MCG/ACT inhaler 2 puff (2 puffs Inhalation Given 07/12/18 1006)     Initial Impression / Assessment and Plan / ED Course  I have reviewed the triage vital signs and the nursing notes.  Pertinent labs & imaging results that were available during my care of the patient were reviewed by me and considered in my medical decision making (see chart for details).        Given the tachycardia, work-up obtained.  Labs are reassuring.  He is low risk for PE with negative d-dimer, no hypoxia or increased work of breathing I think it is unlikely and no further work-up needed.  There is some wheezing and he feels a little better with albuterol.  Stop smoking and he will be given the albuterol inhaler to go home with.  Doubt ACS.  I feel this is unlikely to be COVID-19.  Discharged home with return precautions.  Final Clinical Impressions(s) / ED Diagnoses   Final diagnoses:  Cough  Wheezing    ED Discharge Orders    None       Pricilla Loveless, MD 07/12/18 1040

## 2018-07-12 NOTE — ED Notes (Signed)
Pt called RN to room per [employer]omatic Services operational officer. RN went into the room and pt was not to be found. Pt is assumed to have left prior to receiving discharge instructions. Dr. Criss Alvine notified.

## 2018-07-12 NOTE — Discharge Instructions (Addendum)
If you develop shortness of breath, coughing up blood, fever, vomiting, severe chest pain, or any other new/concerning symptoms then return to the ER for evaluation.  It is very important to stop smoking.  Use the albuterol inhaler 1-2 puffs every 4 hours as needed for cough or shortness of breath.

## 2018-07-12 NOTE — ED Triage Notes (Signed)
Patient complaining of chronic cough and shortness of breath x 2 months.

## 2018-09-22 ENCOUNTER — Emergency Department (HOSPITAL_COMMUNITY)
Admission: EM | Admit: 2018-09-22 | Discharge: 2018-09-22 | Disposition: A | Discharge status: Home / Self Care | Payer: Non-veteran care | Attending: Emergency Medicine | Admitting: Emergency Medicine

## 2018-09-22 ENCOUNTER — Encounter (HOSPITAL_COMMUNITY): Payer: Self-pay

## 2018-09-22 ENCOUNTER — Other Ambulatory Visit: Payer: Self-pay

## 2018-09-22 DIAGNOSIS — F17210 Nicotine dependence, cigarettes, uncomplicated: Secondary | ICD-10-CM | POA: Insufficient documentation

## 2018-09-22 DIAGNOSIS — H60502 Unspecified acute noninfective otitis externa, left ear: Secondary | ICD-10-CM | POA: Insufficient documentation

## 2018-09-22 DIAGNOSIS — Z79899 Other long term (current) drug therapy: Secondary | ICD-10-CM | POA: Insufficient documentation

## 2018-09-22 MED ORDER — NEOMYCIN-POLYMYXIN-HC 3.5-10000-1 OT SUSP: 4.0000 [drp] | Freq: Three times a day (TID) | OTIC | 0 refills | Status: AC

## 2018-09-22 MED ORDER — FLUTICASONE PROPIONATE 50 MCG/ACT NA SUSP: 1.0000 | Freq: Every day | NASAL | 0 refills | Status: AC

## 2018-09-22 NOTE — Discharge Instructions (Signed)
Use the antibiotics ear drops as prescribed. Take them for the full week, even if your pain improves. Use the nasal spray daily to help with ear pressure.  Continue taking home medications as prescribed, including the clindamycin.  Use tylenol and ibuprofen for pain.  Do not put anything other than ear drops in your ear (no q-tips!). Follow up with your primary doctor at the [facility] if your symptoms are not improving.  Return to the ER with any new, worsening, or concerning symptoms.

## 2018-09-22 NOTE — ED Triage Notes (Signed)
Pt reports left earache since Friday.  Reports history of abscess in ear.

## 2018-09-22 NOTE — ED Provider Notes (Signed)
[facility] EMERGENCY DEPARTMENT Provider Note   CSN: [id number] Arrival date & time: 09/22/18  2119    History   Chief Complaint Chief Complaint  Patient presents with  . Otalgia    HPI Joshua Dillon is a 33 y.o. male presenting for evaluation of left ear pain.  Patient states that the past 2 days, he has been having left ear pain.  This began after he was having some ear irritation, and he was scratching at it with a Q-tip.  Since then, pain is been more severe.  He has been taking Goody powders, ibuprofen, and gabapentin without improvement of his symptoms.  He denies fevers, chills, nasal congestion, sore throat, right ear pain, cough, shortness of breath.  Patient states he feels like he needs to pop his ear, but when he holds his nose and puts pressure, pain is worse.  He denies drainage from his ear.  He denies hearing changes.  He is currently on clindamycin for a dental infection.  Additionally, patient with a history of TMJ dysfunction. Pt states his HR is high because of his pain and because hasn't been sleeping well due to pain.     HPI  Past Medical History:  Diagnosis Date  . ADHD (attention deficit hyperactivity disorder)   . Alcoholism ([place])   . Anxiety   . Depression   . Hypercholesterolemia   . Seizure ([place]) 03/2012   Secondary to tramadol.    Patient Active Problem List   Diagnosis Date Noted  . Alcohol abuse with alcohol-induced mood disorder ([place]) 12/30/2014  . Mood disorder ([place]) 03/27/2012  . Alcohol dependence ([place]) 03/25/2012  . Marijuana use 03/22/2012  . Elevated AST (SGOT) 03/22/2012  . Smokeless tobacco use 03/22/2012  . Tobacco use 03/22/2012  . Seizure ([place]) 03/19/2012  . Alcohol abuse 03/19/2012  . Hypokalemia 03/19/2012  . ADHD (attention deficit hyperactivity disorder)   . Anxiety   . Depression     History reviewed. No pertinent surgical history.      Home Medications    Prior to Admission medications   Medication Sig  Start Date End Date Taking? Authorizing Provider  clindamycin (CLEOCIN) 150 MG capsule TAKE 1 CAPSULE BY MOUTH 4 TIMES DAILY 09/03/18  Yes [provider]  cloNIDine (CATAPRES) 0.1 MG tablet Take 1 tablet (0.1 mg total) by mouth 2 (two) times daily. 10/13/15  Yes Rancour, Annie Main, MD  DULoxetine (CYMBALTA) 60 MG capsule Take 60 mg by mouth daily.   Yes [provider]  gabapentin (NEURONTIN) 300 MG capsule TAKE 3 CAPSULES BY MOUTH AT BEDTIME AS NEEDED FOR SLEEP ANXIETY AND PAIN 07/10/18  Yes [provider]  QUEtiapine (SEROQUEL) 200 MG tablet Take 1 tablet (200 mg total) by mouth at bedtime. Patient taking differently: Take 200 mg by mouth at bedtime as needed (sleep).  01/03/15  Yes Rankin, Shuvon B, NP  fluticasone (FLONASE) 50 MCG/ACT nasal spray Place 1 spray into both nostrils daily. 09/22/18   Jasime Westergren, PA-C  neomycin-polymyxin-hydrocortisone (CORTISPORIN) 3.5-10000-1 OTIC suspension Place 4 drops into the left ear 3 (three) times daily for 7 days. 09/22/18 09/29/18  Jereld Presti, PA-C    Family History Family History  Problem Relation Age of Onset  . Drug abuse Mother        narcotics  . Bipolar disorder Mother   . Alcohol abuse Paternal Grandmother   . Anxiety disorder Father     Social History Social History   Tobacco Use  . Smoking status: Current  Every Day Smoker    Packs/day: 0.30    Years: 8.00    Pack years: 2.40    Types: Cigarettes  . Smokeless tobacco: Current User    Types: Snuff, Chew  Substance Use Topics  . Alcohol use: Not Currently    Alcohol/week: 33.0 standard drinks    Types: 33 Standard drinks or equivalent per week  . Drug use: Yes    Frequency: 7.0 times per week    Types: Marijuana, Benzodiazepines     Allergies   Banana   Review of Systems Review of Systems  HENT: Positive for ear pain.   All other systems reviewed and are negative.    Physical Exam Updated Vital Signs BP (!) 139/100   Pulse (!)  123   Temp 98.3 F (36.8 C) (Oral)   Resp 20   Ht 6' (1.829 m)   Wt 81.6 kg   SpO2 93%   BMI 24.41 kg/m   Physical Exam Vitals signs and nursing note reviewed.  Constitutional:      General: He is not in acute distress.    Appearance: He is well-developed.     Comments: Appears nontoxic  HENT:     Head: Normocephalic and atraumatic.     Right Ear: Tympanic membrane, ear canal and external ear normal.     Left Ear: External ear normal.     Ears:     Comments: L ear canal erythematous and swollen. Limited view of L TM due to otitis externa, but no obvious erythema or bulging of the TM    Nose: Mucosal edema present.     Comments: Mild nasal mucosal edema    Mouth/Throat:     Lips: Pink.     Mouth: Mucous membranes are moist.     Pharynx: Oropharynx is clear. Uvula midline. No oropharyngeal exudate or posterior oropharyngeal erythema.     Tonsils: No tonsillar exudate.  Eyes:     Extraocular Movements: Extraocular movements intact.     Conjunctiva/sclera: Conjunctivae normal.     Pupils: Pupils are equal, round, and reactive to light.  Neck:     Musculoskeletal: Normal range of motion.  Cardiovascular:     Rate and Rhythm: Regular rhythm. Tachycardia present.     Pulses: Normal pulses.     Comments: Tachy from 115-120 Pulmonary:     Effort: Pulmonary effort is normal. No respiratory distress.     Breath sounds: Normal breath sounds. No wheezing.  Abdominal:     General: There is no distension.  Musculoskeletal: Normal range of motion.  Skin:    General: Skin is warm.     Capillary Refill: Capillary refill takes less than 2 seconds.     Findings: No rash.  Neurological:     Mental Status: He is alert and oriented to person, place, and time.      ED Treatments / Results  Labs (all labs ordered are listed, but only abnormal results are displayed) Labs Reviewed - No data to display  EKG None  Radiology No results found.  Procedures Procedures (including  critical care time)  Medications Ordered in ED Medications - No data to display   Initial Impression / Assessment and Plan / ED Course  I have reviewed the triage vital signs and the nursing notes.  Pertinent labs & imaging results that were available during my care of the patient were reviewed by me and considered in my medical decision making (see chart for details).  Patient presenting for evaluation of left ear pain.  Physical exam consistent with otitis externa.  Additionally, patient reporting any loss of pressure behind his eardrum, needs to pop his ears.  As such, will add on Flonase to try and help with sensation of ear fullness.  Cortisporin drops for pain and swelling.  Encouraged continued over-the-counter pain control with Tylenol and ibuprofen.  Encourage patient to continue taking his clindamycin for his dental infection.  Patient to follow-up with a primary care doctor for symptoms are not improving.  Of note, patient's heart rate is elevated, but this is likely due to pain.  No chest pain or shortness of breath.  No obvious fevers.  At this time, patient appears safe for discharge.  Return precautions given.  Patient states he understands agrees plan.   Final Clinical Impressions(s) / ED Diagnoses   Final diagnoses:  Acute otitis externa of left ear, unspecified type    ED Discharge Orders         Ordered    neomycin-polymyxin-hydrocortisone (CORTISPORIN) 3.5-10000-1 OTIC suspension  3 times daily     09/22/18 0909    fluticasone (FLONASE) 50 MCG/ACT nasal spray  Daily     09/22/18 0909           Alveria ApleyCaccavale, Aylana Hirschfeld, PA-C 09/22/18 [id number]    Donnetta Hutchingook, Brian, MD 09/22/18 1322

## 2019-02-28 ENCOUNTER — Other Ambulatory Visit: Payer: Self-pay

## 2019-02-28 DIAGNOSIS — Z20822 Contact with and (suspected) exposure to covid-19: Secondary | ICD-10-CM

## 2019-03-03 LAB — NOVEL CORONAVIRUS, NAA: SARS-CoV-2, NAA: NOT DETECTED

## 2019-03-05 ENCOUNTER — Telehealth: Payer: Self-pay | Admitting: General Practice

## 2019-03-05 NOTE — Telephone Encounter (Signed)
Gave patient negative covid test results Patient understood 

## 2019-10-30 IMAGING — DX CHEST - 2 VIEW
2 series · 2 of 2 positions shown · non-contrast
Comparison: PA and lateral chest 10/13/2015.

CLINICAL DATA: Cough and shortness of breath for 2 months.

EXAM:
CHEST - 2 VIEW

[chest pa]
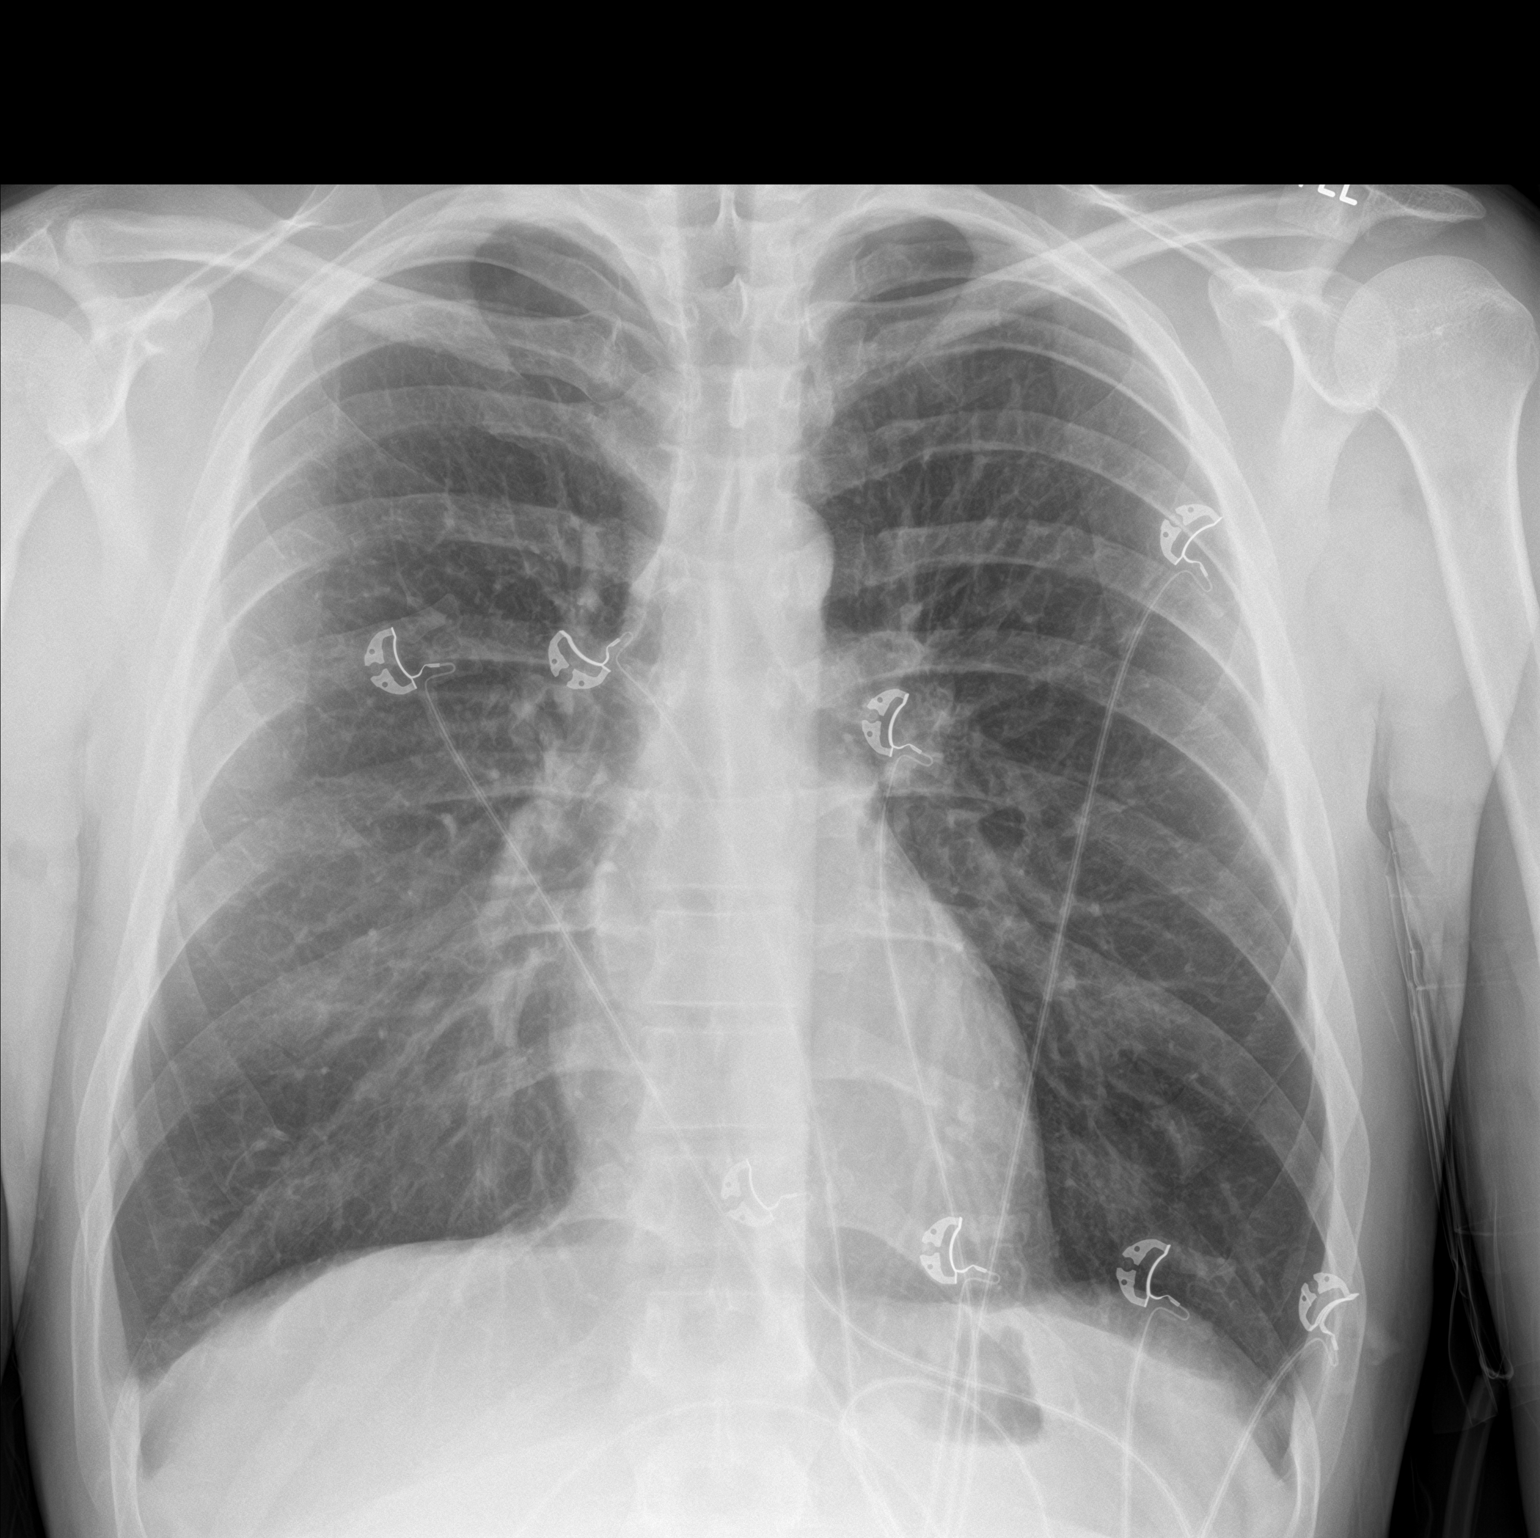

[chest lat]
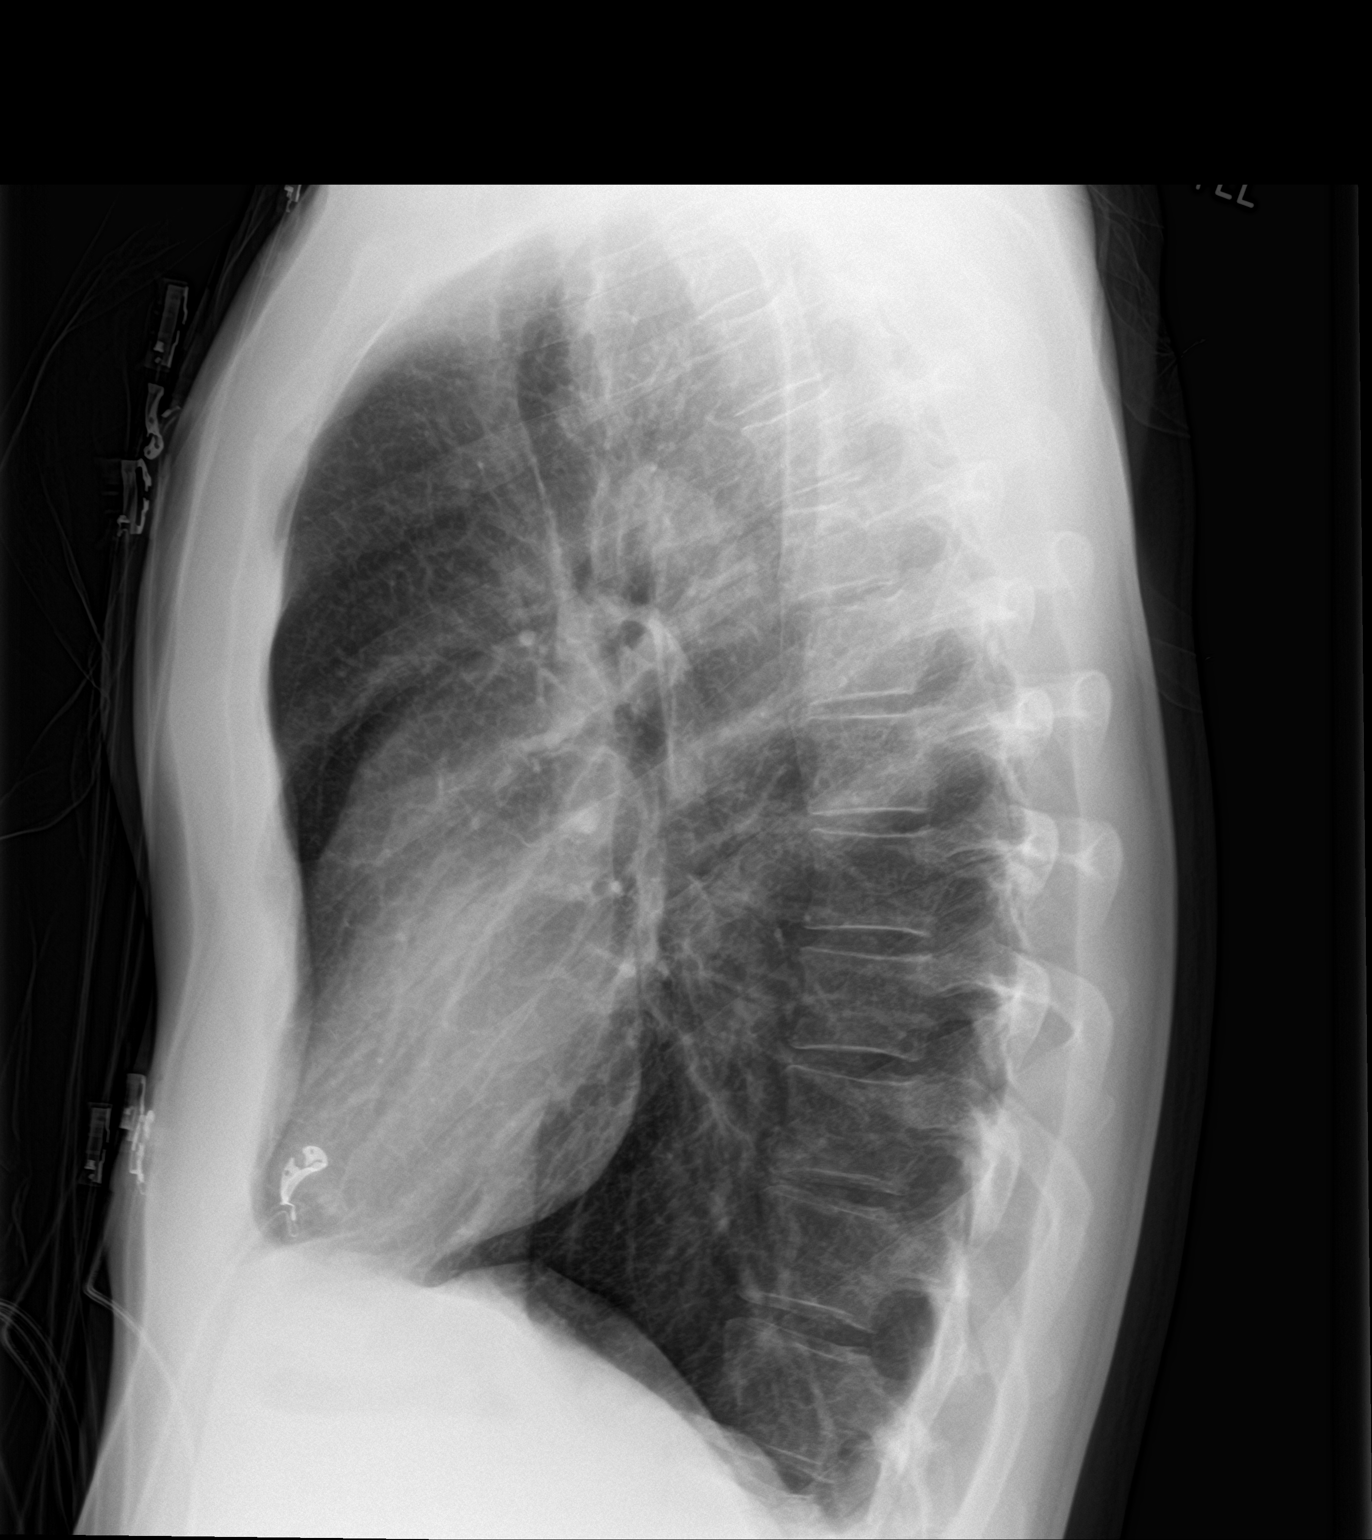

[2 of 2 positions shown; findings below may reference images not displayed]

FINDINGS: The lungs are clear. Heart size is normal. No pneumothorax or
pleural effusion. Pectus deformity noted. No acute bony abnormality.
IMPRESSION: No acute disease.

## 2022-08-03 ENCOUNTER — Other Ambulatory Visit (HOSPITAL_COMMUNITY): Payer: Self-pay | Admitting: Internal Medicine

## 2022-08-03 DIAGNOSIS — G8929 Other chronic pain: Secondary | ICD-10-CM

## 2022-08-11 ENCOUNTER — Other Ambulatory Visit (HOSPITAL_COMMUNITY): Payer: Self-pay | Admitting: Internal Medicine

## 2022-08-11 DIAGNOSIS — G8929 Other chronic pain: Secondary | ICD-10-CM

## 2022-08-24 ENCOUNTER — Ambulatory Visit (HOSPITAL_COMMUNITY)
Admission: RE | Admit: 2022-08-24 | Discharge: 2022-08-24 | Disposition: A | Discharge status: Home / Self Care | Payer: No Typology Code available for payment source | Source: Ambulatory Visit | Attending: Internal Medicine | Admitting: Internal Medicine

## 2022-08-24 DIAGNOSIS — G8929 Other chronic pain: Secondary | ICD-10-CM | POA: Insufficient documentation

## 2022-08-24 DIAGNOSIS — R1013 Epigastric pain: Secondary | ICD-10-CM | POA: Insufficient documentation

## 2022-08-24 MED ORDER — IOHEXOL 300 MG/ML  SOLN
100.0000 mL | Freq: Once | INTRAMUSCULAR | Status: AC | PRN
  Administered 2022-08-24: 100 mL via INTRAVENOUS

## 2023-01-29 ENCOUNTER — Telehealth: Payer: Self-pay

## 2023-01-29 NOTE — Telephone Encounter (Signed)
Attempted to call Mr. Rho with some information as discussed previously. No answer, unable to leave a message due to VM full.  Also contacted father Ritchie as listed as contacts, no answer, left VM requesting return call.   Francee Nodal RN [employer]
